# Patient Record
Sex: Female | Born: 1995 | Race: Black or African American | Hispanic: No | Marital: Single | Smoking: Never smoker
Health system: Southern US, Community
[De-identification: ages and names within clinical notes are randomized; demographics above are authoritative.]

## PROBLEM LIST (undated history)

## (undated) ENCOUNTER — Inpatient Hospital Stay (HOSPITAL_COMMUNITY): Payer: Self-pay

## (undated) DIAGNOSIS — A749 Chlamydial infection, unspecified: Secondary | ICD-10-CM

## (undated) DIAGNOSIS — Q2730 Arteriovenous malformation, site unspecified: Secondary | ICD-10-CM

## (undated) DIAGNOSIS — Q278 Other specified congenital malformations of peripheral vascular system: Secondary | ICD-10-CM

## (undated) DIAGNOSIS — Z8614 Personal history of Methicillin resistant Staphylococcus aureus infection: Secondary | ICD-10-CM

## (undated) HISTORY — PX: OTHER SURGICAL HISTORY: SHX169

## (undated) HISTORY — DX: Arteriovenous malformation, site unspecified: Q27.30

---
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
## 2015-01-03 ENCOUNTER — Inpatient Hospital Stay (HOSPITAL_COMMUNITY)
Admission: AD | Admit: 2015-01-03 | Discharge: 2015-01-04 | Disposition: A | Discharge status: Home / Self Care | Payer: Medicaid Other | Source: Ambulatory Visit | Attending: Obstetrics & Gynecology | Admitting: Obstetrics & Gynecology

## 2015-01-03 ENCOUNTER — Encounter (HOSPITAL_COMMUNITY): Payer: Self-pay | Admitting: *Deleted

## 2015-01-03 DIAGNOSIS — O26891 Other specified pregnancy related conditions, first trimester: Secondary | ICD-10-CM | POA: Diagnosis not present

## 2015-01-03 DIAGNOSIS — O209 Hemorrhage in early pregnancy, unspecified: Secondary | ICD-10-CM

## 2015-01-03 DIAGNOSIS — R109 Unspecified abdominal pain: Secondary | ICD-10-CM | POA: Diagnosis present

## 2015-01-03 DIAGNOSIS — O4691 Antepartum hemorrhage, unspecified, first trimester: Secondary | ICD-10-CM | POA: Insufficient documentation

## 2015-01-03 DIAGNOSIS — Z3A09 9 weeks gestation of pregnancy: Secondary | ICD-10-CM | POA: Diagnosis not present

## 2015-01-03 DIAGNOSIS — O219 Vomiting of pregnancy, unspecified: Secondary | ICD-10-CM | POA: Diagnosis not present

## 2015-01-03 DIAGNOSIS — R112 Nausea with vomiting, unspecified: Secondary | ICD-10-CM | POA: Insufficient documentation

## 2015-01-03 DIAGNOSIS — N939 Abnormal uterine and vaginal bleeding, unspecified: Secondary | ICD-10-CM | POA: Diagnosis present

## 2015-01-03 HISTORY — DX: Other specified congenital malformations of peripheral vascular system: Q27.8

## 2015-01-03 LAB — CBC WITH DIFFERENTIAL/PLATELET
Basophils Absolute: 0 10*3/uL (ref 0.0–0.1)
Basophils Relative: 0 %
EOS ABS: 0.1 10*3/uL (ref 0.0–0.7)
Eosinophils Relative: 1 %
HCT: 35.7 % — ABNORMAL LOW (ref 36.0–46.0)
HEMOGLOBIN: 12.1 g/dL (ref 12.0–15.0)
LYMPHS ABS: 1.9 10*3/uL (ref 0.7–4.0)
Lymphocytes Relative: 11 %
MCH: 28.5 pg (ref 26.0–34.0)
MCHC: 33.9 g/dL (ref 30.0–36.0)
MCV: 84.2 fL (ref 78.0–100.0)
MONO ABS: 1.3 10*3/uL — AB (ref 0.1–1.0)
MONOS PCT: 8 %
NEUTROS PCT: 80 %
Neutro Abs: 14 10*3/uL — ABNORMAL HIGH (ref 1.7–7.7)
Platelets: 253 10*3/uL (ref 150–400)
RBC: 4.24 MIL/uL (ref 3.87–5.11)
RDW: 14.7 % (ref 11.5–15.5)
WBC: 17.4 10*3/uL — ABNORMAL HIGH (ref 4.0–10.5)

## 2015-01-03 LAB — URINALYSIS, ROUTINE W REFLEX MICROSCOPIC
Bilirubin Urine: NEGATIVE
Glucose, UA: NEGATIVE mg/dL
Ketones, ur: 15 mg/dL — AB
Nitrite: NEGATIVE
Protein, ur: NEGATIVE mg/dL
Specific Gravity, Urine: >1.03 — ABNORMAL HIGH (ref 1.005–1.030)
Urobilinogen, UA: 0.2 mg/dL (ref 0.0–1.0)
pH: 5.5 (ref 5.0–8.0)

## 2015-01-03 LAB — URINE MICROSCOPIC-ADD ON

## 2015-01-03 LAB — POCT PREGNANCY, URINE: Preg Test, Ur: POSITIVE — AB

## 2015-01-03 NOTE — MAU Note (Signed)
Had postive upt late August and had spotting mid Sept and Sept 27 thru 28. LMP 7/28 Has had some abd pain off and on.

## 2015-01-03 NOTE — MAU Provider Note (Signed)
History     CSN: [id number]  Arrival date and time: 01/03/15 2235   First Provider Initiated Contact with Patient 01/03/15 2323      Chief Complaint  Patient presents with  . Abdominal Pain   HPI Maria Orr 19 y.o. G1P0  presents to MAU for evaluation of vaginal bleeding.  She reports her LMP was 7/28 and was normal - lasting 3 days with heavy flow each day.  On 9/16, she started 3 days of spotting.  She has cramping in her mid lower abdomen that comes and goes but is not bothersome.  She is eating and drinking well.     She is having intermittent nausea.  She denies weakness, SOB, CP, URI sxs, dysuria, other signs of sickness.   OB History    Gravida Para Term Preterm AB TAB SAB Ectopic Multiple Living   1               Past Medical History  Diagnosis Date  . 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 Cancer Brother   . Neuropathy Mother     Social History  Substance Use Topics  . Smoking status: Never Smoker   . Smokeless tobacco: None  . Alcohol Use: No    Allergies: No Known Allergies  No prescriptions prior to admission    ROS Pertinent ROS in HPI.  All other systems are negative.   Physical Exam   Blood pressure 136/83, pulse 112, temperature 98.5 F (36.9 C), resp. rate 18, height  (1.549 m), weight 141 lb 6.4 oz (64.139 kg), last menstrual period 10/30/2014.  Physical Exam  Constitutional: She is oriented to person, place, and time. She appears well-developed and well-nourished. No distress.  HENT:  Head: Normocephalic and atraumatic.  Eyes: EOM are normal.  Neck: Normal range of motion.  Cardiovascular: Normal rate.   Respiratory: No respiratory distress.  GI: Soft.  Genitourinary:  Large amt of thin, frothy white discharge  Cervix with small  punctate erythematous regions No CMT.  No adnexal mass or tenderness  Musculoskeletal: Normal range of motion.  Neurological: She is alert and oriented to person, place, and time.  Skin: Skin is warm and dry.  Psychiatric: She has a normal mood and affect.   Results for orders placed or performed during the hospital encounter of 01/03/15 (from the past 24 hour(s))  Urinalysis, Routine w reflex microscopic (not at [facility])     Status: Abnormal   Collection Time: 01/03/15 10:55 PM  Result Value Ref Range   Color, Urine YELLOW YELLOW   APPearance HAZY (A) CLEAR   Specific Gravity, Urine >1.030 (H) 1.005 - 1.030   pH 5.5 5.0 - 8.0   Glucose, UA NEGATIVE NEGATIVE mg/dL   Hgb urine dipstick LARGE (A) NEGATIVE   Bilirubin Urine NEGATIVE NEGATIVE   Ketones, ur 15 (A) NEGATIVE mg/dL   Protein, ur NEGATIVE NEGATIVE mg/dL   Urobilinogen, UA 0.2 0.0 - 1.0 mg/dL   Nitrite NEGATIVE NEGATIVE   Leukocytes, UA SMALL (A) NEGATIVE  Urine microscopic-add on     Status: Abnormal   Collection Time: 01/03/15 10:55 PM  Result Value Ref Range   Squamous Epithelial / LPF FEW (A) RARE   WBC, UA 3-6 <3  WBC/hpf   RBC / HPF 3-6 <3 RBC/hpf   Bacteria, UA FEW (A) RARE   Urine-Other MUCOUS PRESENT   Pregnancy, urine POC     Status: Abnormal   Collection Time: 01/03/15 11:02 PM  Result Value Ref Range   Preg Test, Ur POSITIVE (A) NEGATIVE  CBC with Differential/Platelet     Status: Abnormal   Collection Time: 01/03/15 11:35 PM  Result Value Ref Range   WBC 17.4 (H) 4.0 - 10.5 K/uL   RBC 4.24 3.87 - 5.11 MIL/uL   Hemoglobin 12.1 12.0 - 15.0 g/dL   HCT 13.0 (L) 86.5 - 78.4 %   MCV 84.2 78.0 - 100.0 fL   MCH 28.5 26.0 - 34.0 pg   MCHC 33.9 30.0 - 36.0 g/dL   RDW 69.6 29.5 - 28.4 %   Platelets 253 150 - 400 K/uL   Neutrophils Relative % 80 %   Neutro Abs 14.0 (H) 1.7 - 7.7 K/uL   Lymphocytes Relative 11 %   Lymphs Abs 1.9 0.7 - 4.0 K/uL   Monocytes Relative 8 %   Monocytes Absolute 1.3 (H) 0.1 - 1.0 K/uL    Eosinophils Relative 1 %   Eosinophils Absolute 0.1 0.0 - 0.7 K/uL   Basophils Relative 0 %   Basophils Absolute 0.0 0.0 - 0.1 K/uL  ABO/Rh     Status: None (Preliminary result)   Collection Time: 01/03/15 11:35 PM  Result Value Ref Range   ABO/RH(D) A POS   hCG, quantitative, pregnancy     Status: Abnormal   Collection Time: 01/03/15 11:35 PM  Result Value Ref Range   hCG, Beta Chain, Quant, S 113680 (H) <5 mIU/mL  Wet prep, genital     Status: Abnormal   Collection Time: 01/04/15 12:25 AM  Result Value Ref Range   Yeast Wet Prep HPF POC NONE SEEN NONE SEEN   Trich, Wet Prep NONE SEEN NONE SEEN   Clue Cells Wet Prep HPF POC FEW (A) NONE SEEN   WBC, Wet Prep HPF POC MODERATE (A) NONE SEEN   US Ob Comp Less 14 Wks  01/04/2015   CLINICAL DATA:  Intermittent pelvic pain.  Spotting.  EXAM: OBSTETRIC <14 WK ULTRASOUND  TECHNIQUE: Transabdominal ultrasound was performed for evaluation of the gestation as well as the maternal uterus and adnexal regions.  COMPARISON:  None.  FINDINGS: Intrauterine gestational sac: Visualized/normal in shape. No subchorionic hematoma.  Yolk sac:  Present  Embryo:  Present  Cardiac Activity: Present  Heart Rate: 173 bpm  CRL:   44  mm   11 w 1 d                  [place] EDC: 07/25/2015  Maternal uterus/adnexae: Physiologic appearance of the ovaries. No free pelvic fluid.  IMPRESSION: Single intrauterine gestation measuring 11 weeks 1 day. No pathologic findings.   Electronically Signed   By: Marnee Spring M.D.   On: 01/04/2015 01:18   MAU Course  Procedures  MDM Ectopic workup ordered to eval unusual vaginal bleeding in setting of positive pregnancy test and no prenatal care.   Viable IUP noted on u/s at 11w1 day.  Ectopic ruled out.     Assessment and Plan  A:  1. Nausea/vomiting in pregnancy   2. Vaginal bleeding in pregnancy, first trimester    P: Discharge to home PNV qd Begin [facility] asap Pregnancy verification letter provied Diet/eating discussed.   Pt states no nausea med required at this time.  Patient  may return to MAU as needed or if her condition were to change or worsen   Bertram Denver 01/03/2015, 11:23 PM

## 2015-01-04 ENCOUNTER — Inpatient Hospital Stay (HOSPITAL_COMMUNITY): Payer: Medicaid Other

## 2015-01-04 ENCOUNTER — Encounter (HOSPITAL_COMMUNITY): Payer: Self-pay | Admitting: *Deleted

## 2015-01-04 DIAGNOSIS — O219 Vomiting of pregnancy, unspecified: Secondary | ICD-10-CM | POA: Diagnosis not present

## 2015-01-04 LAB — ABO/RH: ABO/RH(D): A POS

## 2015-01-04 LAB — HIV ANTIBODY (ROUTINE TESTING W REFLEX): HIV SCREEN 4TH GENERATION: NONREACTIVE

## 2015-01-04 LAB — RPR: RPR Ser Ql: NONREACTIVE

## 2015-01-04 LAB — WET PREP, GENITAL
TRICH WET PREP: NONE SEEN
YEAST WET PREP: NONE SEEN

## 2015-01-04 LAB — HCG, QUANTITATIVE, PREGNANCY: HCG, BETA CHAIN, QUANT, S: 113680 m[IU]/mL — AB (ref <5)

## 2015-01-04 MED ORDER — PRENATAL VITAMINS PLUS 27-1 MG PO TABS
1.0000 | ORAL_TABLET | Freq: Every day | ORAL | Status: DC
Start: 2015-01-04 — End: 2020-12-14

## 2015-01-04 NOTE — Progress Notes (Signed)
Clydie Braun Teague-clark PA in to discuss test results and d/c plan. Written and verbal d/c instructions given and understanding voiced

## 2015-01-04 NOTE — Discharge Instructions (Signed)
Prenatal Care  WHAT IS PRENATAL CARE?  Prenatal care means health care during your pregnancy, before your baby is born. It is very important to take care of yourself and your baby during your pregnancy by:   Getting early prenatal care. If you know you are pregnant, or think you might be pregnant, call your health care provider as soon as possible. Schedule a visit for a prenatal exam.  Getting regular prenatal care. Follow your health care provider's schedule for blood and other necessary tests. Do not miss appointments.  Doing everything you can to keep yourself and your baby healthy during your pregnancy.  Getting complete care. Prenatal care should include evaluation of the medical, dietary, educational, psychological, and social needs of you and your significant other. The medical and genetic history of your family and the family of your baby's father should be discussed with your health care provider.  Discussing with your health care provider:  Prescription, over-the-counter, and herbal medicines that you take.  Any history of substance abuse, alcohol use, smoking, and illegal drug use.  Any history of domestic abuse and violence.  Immunizations you have received.  Your nutrition and diet.  The amount of exercise you do.  Any environmental and occupational hazards to which you are exposed.  History of sexually transmitted infections for both you and your partner.  Previous pregnancies you have had. WHY IS PRENATAL CARE SO IMPORTANT?  By regularly seeing your health care provider, you help ensure that problems can be identified early so that they can be treated as soon as possible. Other problems might be prevented. Many studies have shown that early and regular prenatal care is important for the health of mothers and their babies.  HOW CAN I TAKE CARE OF MYSELF WHILE I AM PREGNANT?  Here are ways to take care of yourself and your baby:   Start or continue taking your  multivitamin with 400 micrograms (mcg) of folic acid every day.  Get early and regular prenatal care. It is very important to see a health care provider during your pregnancy. Your health care provider will check at each visit to make sure that you and your baby are healthy. If there are any problems, action can be taken right away to help you and your baby.  Eat a healthy diet that includes:  Fruits.  Vegetables.  Foods low in saturated fat.  Whole grains.  Calcium-rich foods, such as milk, yogurt, and hard cheeses.  Drink 6-8 glasses of liquids a day.  Unless your health care provider tells you not to, try to be physically active for 30 minutes, most days of the week. If you are pressed for time, you can get your activity in through 10-minute segments, three times a day.  Do not smoke, drink alcohol, or use drugs. These can cause long-term damage to your baby. Talk with your health care provider about steps to take to stop smoking. Talk with a member of your faith community, a counselor, a trusted friend, or your health care provider if you are concerned about your alcohol or drug use.  Ask your health care provider before taking any medicine, even over-the-counter medicines. Some medicines are not safe to take during pregnancy.  Get plenty of rest and sleep.  Avoid hot tubs and saunas during pregnancy.  Do not have X-rays taken unless absolutely necessary and with the recommendation of your health care provider. A lead shield can be placed on your abdomen to protect your baby when   X-rays are taken in other parts of your body.  Do not empty the cat litter when you are pregnant. It may contain a parasite that causes an infection called toxoplasmosis, which can cause birth defects. Also, use gloves when working in garden areas used by cats.  Do not eat uncooked or undercooked meats or fish.  Do not eat soft, mold-ripened cheeses (Brie, Camembert, and chevre) or soft, blue-veined  cheese (Danish blue and Roquefort).  Stay away from toxic chemicals like:  Insecticides.  Solvents (some cleaners or paint thinners).  Lead.  Mercury.  Sexual intercourse may continue until the end of the pregnancy, unless you have a medical problem or there is a problem with the pregnancy and your health care provider tells you not to.  Do not wear high-heel shoes, especially during the second half of the pregnancy. You can lose your balance and fall.  Do not take long trips, unless absolutely necessary. Be sure to see your health care provider before going on the trip.  Do not sit in one position for more than 2 hours when on a trip.  Take a copy of your medical records when going on a trip. Know where a hospital is located in the city you are visiting, in case of an emergency.  Most dangerous household products will have pregnancy warnings on their labels. Ask your health care provider about products if you are unsure.  Limit or eliminate your caffeine intake from coffee, tea, sodas, medicines, and chocolate.  Many women continue working through pregnancy. Staying active might help you stay healthier. If you have a question about the safety or the hours you work at your particular job, talk with your health care provider.  Get informed:  Read books.  Watch videos.  Go to childbirth classes for you and your significant other.  Talk with experienced moms.  Ask your health care provider about childbirth education classes for you and your partner. Classes can help you and your partner prepare for the birth of your baby.  Ask about a baby doctor (pediatrician) and methods and pain medicine for labor, delivery, and possible cesarean delivery. HOW OFTEN SHOULD I SEE MY HEALTH CARE PROVIDER DURING PREGNANCY?  Your health care provider will give you a schedule for your prenatal visits. You will have visits more often as you get closer to the end of your pregnancy. An average  pregnancy lasts about 40 weeks.  A typical schedule includes visiting your health care provider:   About once each month during your first 6 months of pregnancy.  Every 2 weeks during the next 2 months.  Weekly in the last month, until the delivery date. Your health care provider will probably want to see you more often if:  You are older than 35 years.  Your pregnancy is high risk because you have certain health problems or problems with the pregnancy, such as:  Diabetes.  High blood pressure.  The baby is not growing on schedule, according to the dates of the pregnancy. Your health care provider will do special tests to make sure you and your baby are not having any serious problems. WHAT HAPPENS DURING PRENATAL VISITS?   At your first prenatal visit, your health care provider will do a physical exam and talk to you about your health history and the health history of your partner and your family. Your health care provider will be able to tell you what date to expect your baby to be born on.  Your   first physical exam will include checks of your blood pressure, measurements of your height and weight, and an exam of your pelvic organs. Your health care provider will do a Pap test if you have not had one recently and will do cultures of your cervix to make sure there is no infection.  At each prenatal visit, there will be tests of your blood, urine, blood pressure, weight, and the progress of the baby will be checked.  At your later prenatal visits, your health care provider will check how you are doing and how your baby is developing. You may have a number of tests done as your pregnancy progresses.  Ultrasound exams are often used to check on your baby's growth and health.  You may have more urine and blood tests, as well as special tests, if needed. These may include amniocentesis to examine fluid in the pregnancy sac, stress tests to check how the baby responds to contractions, or a  biophysical profile to measure your baby's well-being. Your health care provider will explain the tests and why they are necessary.  You should be tested for high blood sugar (gestational diabetes) between the 24th and 28th weeks of your pregnancy.  You should discuss with your health care provider your plans to breastfeed or bottle-feed your baby.  Each visit is also a chance for you to learn about staying healthy during pregnancy and to ask questions. Document Released: 03/24/2003 Document Revised: 03/26/2013 Document Reviewed: 06/05/2013 ExitCare Patient Information 2015 [employer]. This information is not intended to replace advice given to you by your health care provider. Make sure you discuss any questions you have with your health care provider.  

## 2015-01-05 LAB — GC/CHLAMYDIA PROBE AMP (~~LOC~~) NOT AT ARMC
Chlamydia: POSITIVE — AB
Neisseria Gonorrhea: NEGATIVE

## 2015-01-06 ENCOUNTER — Telehealth: Payer: Self-pay | Admitting: *Deleted

## 2015-01-06 DIAGNOSIS — A749 Chlamydial infection, unspecified: Secondary | ICD-10-CM

## 2015-01-06 DIAGNOSIS — O98811 Other maternal infectious and parasitic diseases complicating pregnancy, first trimester: Principal | ICD-10-CM

## 2015-01-06 MED ORDER — AZITHROMYCIN 500 MG PO TABS: ORAL_TABLET | ORAL | Status: DC

## 2015-01-06 NOTE — Telephone Encounter (Signed)
Telephone call to patient regarding positive chlamydia culture, patient notified.  Patient has not been treated, so Rx routed to her pharmacy per protocol.  Instructed patient to notify her partner for treatment and to abstain from sex for seven days post treatment.  Report faxed to health department.

## 2015-02-07 ENCOUNTER — Inpatient Hospital Stay (HOSPITAL_COMMUNITY)
Admission: AD | Admit: 2015-02-07 | Discharge: 2015-02-07 | Disposition: A | Discharge status: Home / Self Care | Payer: Medicaid Other | Source: Ambulatory Visit | Attending: Obstetrics and Gynecology | Admitting: Obstetrics and Gynecology

## 2015-02-07 ENCOUNTER — Encounter (HOSPITAL_COMMUNITY): Payer: Self-pay | Admitting: *Deleted

## 2015-02-07 DIAGNOSIS — O98812 Other maternal infectious and parasitic diseases complicating pregnancy, second trimester: Secondary | ICD-10-CM | POA: Insufficient documentation

## 2015-02-07 DIAGNOSIS — Z3A16 16 weeks gestation of pregnancy: Secondary | ICD-10-CM | POA: Diagnosis not present

## 2015-02-07 DIAGNOSIS — R102 Pelvic and perineal pain: Secondary | ICD-10-CM | POA: Diagnosis present

## 2015-02-07 DIAGNOSIS — B373 Candidiasis of vulva and vagina: Secondary | ICD-10-CM | POA: Diagnosis not present

## 2015-02-07 DIAGNOSIS — B3731 Acute candidiasis of vulva and vagina: Secondary | ICD-10-CM

## 2015-02-07 LAB — WET PREP, GENITAL
Clue Cells Wet Prep HPF POC: NONE SEEN
TRICH WET PREP: NONE SEEN

## 2015-02-07 MED ORDER — NYSTATIN 100000 UNIT/GM EX CREA: TOPICAL_CREAM | CUTANEOUS | Status: DC

## 2015-02-07 MED ORDER — TERCONAZOLE 0.8 % VA CREA: 1.0000 | TOPICAL_CREAM | Freq: Every day | VAGINAL | Status: DC

## 2015-02-07 NOTE — MAU Note (Signed)
Pt presents to MAU with complaints of vaginal irritation with pain. Pt states that she has just changed soaps. Denies any vaginal bleeding

## 2015-02-07 NOTE — MAU Provider Note (Signed)
  History     CSN: [id number]  Arrival date and time: 02/07/15 1800   First Provider Initiated Contact with Patient 02/07/15 1929      Chief Complaint  Patient presents with  . Vaginal Pain   HPI [name] is a 19 y.o. G1P0 at 718w1d who presents to MAU today with complaint of vaginal discharge and irritation. She states that this has been present for the last few days. She has changed some of her soap products and feels that may be related. She states itching both internally and externally of the vaginal area. She is also having a thick, white discharge. She notes redness of the labia, but denies sores or lesions. She denies abdominal pain, vaginal bleeding, fever or UTI symptoms. She has an appointment for the [facility] to start prenatal next week.   OB History    Gravida Para Term Preterm AB TAB SAB Ectopic Multiple Living   1               Past Medical History  Diagnosis Date  . 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 Cancer Brother   . Neuropathy Mother     Social History  Substance Use Topics  . Smoking status: Never Smoker   . Smokeless tobacco: None  . Alcohol Use: No    Allergies: No Known Allergies  No prescriptions prior to admission    Review of Systems  Constitutional: Negative for fever and malaise/fatigue.  Gastrointestinal: Negative for abdominal pain.  Genitourinary: Negative for dysuria, urgency and frequency.       + vaginal discharge Neg - vaginal bleeding   Physical Exam   Blood pressure 122/74, pulse 97, temperature 98 F (36.7 C), resp. rate 18, height 5\' 1"  (1.549 m), weight 135 lb (61.236 kg), last menstrual period 10/30/2014.  Physical Exam  Nursing note and vitals reviewed. Constitutional: She is oriented to person, place, and time. She  appears well-developed and well-nourished. No distress.  HENT:  Head: Normocephalic and atraumatic.  Cardiovascular: Normal rate.   Respiratory: Effort normal.  GI: Soft. She exhibits no distension and no mass. There is no tenderness. There is no rebound and no guarding.  Genitourinary: Uterus is enlarged. Uterus is not tender. Cervix exhibits no motion tenderness, no discharge and no friability. No bleeding in the vagina. Vaginal discharge (moderate amount of thick white-yellow discharge noted) found.  Neurological: She is alert and oriented to person, place, and time.  Skin: Skin is warm and dry. No erythema.  Psychiatric: She has a normal mood and affect.  Dilation: Closed Effacement (%): Thick Exam by:: Maria NippleJulie Naman Spychalski PA   MAU Course  Procedures None  MDM FHR - 145 bpm with doppler UA, wet prep today  Assessment and Plan  A: SIUP at 2718w1d Yeast vulvovaginitis, clinical  P: Discharge home Rx for Terazol and Nystatin given to patient Second trimester precautions discussed Patient advised to follow-up with [facility] as planned to start prenatal care this week Patient may return to MAU as needed or if her condition were to change or worsen  Maria LowensteinJulie N Maria Code, PA-C  02/07/2015, 8:06 PM

## 2015-02-07 NOTE — Progress Notes (Signed)
Wet prep only  

## 2015-02-07 NOTE — Discharge Instructions (Signed)

## 2015-02-09 ENCOUNTER — Other Ambulatory Visit (HOSPITAL_COMMUNITY)
Admission: RE | Admit: 2015-02-09 | Discharge: 2015-02-09 | Disposition: A | Discharge status: Home / Self Care | Payer: Medicaid Other | Source: Ambulatory Visit | Attending: Family Medicine | Admitting: Family Medicine

## 2015-02-09 ENCOUNTER — Ambulatory Visit (INDEPENDENT_AMBULATORY_CARE_PROVIDER_SITE_OTHER): Payer: Medicaid Other | Admitting: Family Medicine

## 2015-02-09 ENCOUNTER — Encounter: Payer: Self-pay | Admitting: Family Medicine

## 2015-02-09 VITALS — BP 112/60 | HR 88 | Temp 98.4°F | Wt 141.0 lb

## 2015-02-09 DIAGNOSIS — A749 Chlamydial infection, unspecified: Secondary | ICD-10-CM | POA: Diagnosis not present

## 2015-02-09 DIAGNOSIS — O98819 Other maternal infectious and parasitic diseases complicating pregnancy, unspecified trimester: Secondary | ICD-10-CM

## 2015-02-09 DIAGNOSIS — O98319 Other infections with a predominantly sexual mode of transmission complicating pregnancy, unspecified trimester: Secondary | ICD-10-CM

## 2015-02-09 DIAGNOSIS — Z113 Encounter for screening for infections with a predominantly sexual mode of transmission: Secondary | ICD-10-CM | POA: Insufficient documentation

## 2015-02-09 DIAGNOSIS — O0992 Supervision of high risk pregnancy, unspecified, second trimester: Secondary | ICD-10-CM | POA: Diagnosis not present

## 2015-02-09 DIAGNOSIS — O0990 Supervision of high risk pregnancy, unspecified, unspecified trimester: Secondary | ICD-10-CM | POA: Insufficient documentation

## 2015-02-09 DIAGNOSIS — Q2730 Arteriovenous malformation, site unspecified: Secondary | ICD-10-CM

## 2015-02-09 LAB — POCT URINALYSIS DIP (DEVICE)
Bilirubin Urine: NEGATIVE
Glucose, UA: NEGATIVE mg/dL
NITRITE: NEGATIVE
PH: 5.5 (ref 5.0–8.0)
PROTEIN: NEGATIVE mg/dL
Specific Gravity, Urine: >=1.03 (ref 1.005–1.030)
Urobilinogen, UA: 0.2 mg/dL (ref 0.0–1.0)

## 2015-02-09 NOTE — Progress Notes (Signed)
Nutrition note: 1st visit consult Pt has lost 4# @ 3747w3d, which pt thinks is related to the N&V she sometimes has. Pt reports eating 3 meals & 6 snacks/d. Pt is taking a PNV & pt thinks this is why she is having N&V. Pt reports no heartburn. Pt received verbal & written education on general nutrition during pregnancy. Discussed tips to decrease N&V and suggested pt try 2 chewable multivitamins instead of PNV. Discussed importance & benefits of BF. Encouraged pt to eat a protein source with all meals & snacks. Discussed wt gain goals of 15-25# or 0.6#/wk. Pt agrees to continue taking a PNV or equivalent.  Pt does not have WIC but plans to apply. Pt plans to BF. F/u as needed Blondell RevealLaura Sabriya Yono, MS, RD, LDN, [facility]

## 2015-02-09 NOTE — Progress Notes (Signed)
Subjective:  Maria Orr is a 19 y.o. G1P0 at 4975w3d being seen today for ongoing prenatal care.  Patient reports no complaints.  Contractions: Not present.  Vag. Bleeding: None. Movement: Present. Denies leaking of fluid.   [facility] for abdominal pain- limited US, dx with GERD  The following portions of the patient's history were reviewed and updated as appropriate: allergies, current medications, past family history, past medical history, past social history, past surgical history and problem list. Problem list updated.  Objective:   Filed Vitals:   02/09/15 0925  BP: 112/60  Pulse: 88  Temp: 98.4 F (36.9 C)  Weight: 141 lb (63.957 kg)    Fetal Status: Fetal Heart Rate (bpm): 150   Movement: Present     General:  Alert, oriented and cooperative. Patient is in no acute distress.  Skin: Skin is warm and dry. No rash noted.   Cardiovascular: Normal heart rate noted  Respiratory: Normal respiratory effort, no problems with respiration noted  Abdomen: Soft, gravid, appropriate for gestational age. Pain/Pressure: Absent     Pelvic: Vag. Bleeding: None Vag D/C Character: White   Cervical exam deferred        Extremities: Normal range of motion.  Edema: None  Mental Status: Normal mood and affect. Normal behavior. Normal judgment and thought content.   Urinalysis: Urine Protein: Negative Urine Glucose: Negative  Assessment and Plan:  Pregnancy: G1P0 at 5475w3d  1. Supervision of high risk pregnancy, antepartum, second trimester - added pregnancy box - Prenatal Profile - Prescript Monitor Profile(19) - Culture, OB Urine - GC/Chlamydia probe amp ([place])not at [facility] - Schedule anatomy scan  2. Chlamydia infection during pregnancy, antepartum -TOC today but discussed that likely will be positive since she vomited up azithro  3. AVM (congenital arteriovenous malformation) - No need for additional testing or HRC --> [facility]  Preterm labor symptoms and general  obstetric precautions including but not limited to vaginal bleeding, contractions, leaking of fluid and fetal movement were reviewed in detail with the patient. Please refer to After Visit Summary for other counseling recommendations.  Return in about 4 weeks (around 03/09/2015) for Routine prenatal care.  Federico FlakeKimberly Niles Prescious Hurless, MD

## 2015-02-09 NOTE — Patient Instructions (Signed)

## 2015-02-09 NOTE — Progress Notes (Signed)
Here for first ob visit. Has been to MAU. Given prenatal education booklets. Moderate hemoglobin noted in urinalysis.

## 2015-02-09 NOTE — Progress Notes (Signed)
Anatomy U/S with [facility] 03/09/15 @ 730a

## 2015-02-10 LAB — PRESCRIPTION MONITORING PROFILE (19 PANEL)
AMPHETAMINE/METH: NEGATIVE ng/mL
BARBITURATE SCREEN, URINE: NEGATIVE ng/mL
BENZODIAZEPINE SCREEN, URINE: NEGATIVE ng/mL
Buprenorphine, Urine: NEGATIVE ng/mL
COCAINE METABOLITES: NEGATIVE ng/mL
CREATININE, URINE: 277.09 mg/dL (ref >20.0)
Cannabinoid Scrn, Ur: NEGATIVE ng/mL
Carisoprodol, Urine: NEGATIVE ng/mL
Fentanyl, Ur: NEGATIVE ng/mL
MDMA URINE: NEGATIVE ng/mL
Meperidine, Ur: NEGATIVE ng/mL
Methadone Screen, Urine: NEGATIVE ng/mL
Methaqualone: NEGATIVE ng/mL
NITRITES URINE, INITIAL: NEGATIVE ug/mL
OPIATE SCREEN, URINE: NEGATIVE ng/mL
Oxycodone Screen, Ur: NEGATIVE ng/mL
PH URINE, INITIAL: 5.5 pH (ref 4.5–8.9)
Phencyclidine, Ur: NEGATIVE ng/mL
Propoxyphene: NEGATIVE ng/mL
TRAMADOL UR: NEGATIVE ng/mL
Tapentadol, urine: NEGATIVE ng/mL
ZOLPIDEM, URINE: NEGATIVE ng/mL

## 2015-02-10 LAB — CULTURE, OB URINE: Colony Count: 8000

## 2015-02-10 LAB — PRENATAL PROFILE (SOLSTAS)
Antibody Screen: NEGATIVE
Basophils Absolute: 0 10*3/uL (ref 0.0–0.1)
Basophils Relative: 0 % (ref 0–1)
EOS PCT: 0 % (ref 0–5)
Eosinophils Absolute: 0 10*3/uL (ref 0.0–0.7)
HEMATOCRIT: 35.5 % — AB (ref 36.0–46.0)
HIV: NONREACTIVE
Hemoglobin: 12 g/dL (ref 12.0–15.0)
Hepatitis B Surface Ag: NEGATIVE
LYMPHS PCT: 20 % (ref 12–46)
Lymphs Abs: 2.2 10*3/uL (ref 0.7–4.0)
MCH: 28.8 pg (ref 26.0–34.0)
MCHC: 33.8 g/dL (ref 30.0–36.0)
MCV: 85.1 fL (ref 78.0–100.0)
MONO ABS: 0.8 10*3/uL (ref 0.1–1.0)
MONOS PCT: 7 % (ref 3–12)
MPV: 8.9 fL (ref 8.6–12.4)
Neutro Abs: 8.1 10*3/uL — ABNORMAL HIGH (ref 1.7–7.7)
Neutrophils Relative %: 73 % (ref 43–77)
PLATELETS: 289 10*3/uL (ref 150–400)
RBC: 4.17 MIL/uL (ref 3.87–5.11)
RDW: 15 % (ref 11.5–15.5)
RH TYPE: POSITIVE
RUBELLA: 6.18 {index} — AB (ref <0.90)
WBC: 11.1 10*3/uL — AB (ref 4.0–10.5)

## 2015-02-10 LAB — GC/CHLAMYDIA PROBE AMP (~~LOC~~) NOT AT ARMC
Chlamydia: NEGATIVE
NEISSERIA GONORRHEA: NEGATIVE

## 2015-02-12 ENCOUNTER — Inpatient Hospital Stay (HOSPITAL_COMMUNITY): Payer: Medicaid Other

## 2015-02-12 ENCOUNTER — Encounter (HOSPITAL_COMMUNITY): Payer: Self-pay

## 2015-02-12 ENCOUNTER — Inpatient Hospital Stay (HOSPITAL_COMMUNITY)
Admission: AD | Admit: 2015-02-12 | Discharge: 2015-02-12 | Disposition: A | Discharge status: Home / Self Care | Payer: Medicaid Other | Source: Ambulatory Visit | Attending: Family Medicine | Admitting: Family Medicine

## 2015-02-12 DIAGNOSIS — O26852 Spotting complicating pregnancy, second trimester: Secondary | ICD-10-CM

## 2015-02-12 DIAGNOSIS — R109 Unspecified abdominal pain: Secondary | ICD-10-CM | POA: Diagnosis present

## 2015-02-12 DIAGNOSIS — Z3A16 16 weeks gestation of pregnancy: Secondary | ICD-10-CM | POA: Diagnosis not present

## 2015-02-12 DIAGNOSIS — O26899 Other specified pregnancy related conditions, unspecified trimester: Secondary | ICD-10-CM

## 2015-02-12 DIAGNOSIS — O209 Hemorrhage in early pregnancy, unspecified: Secondary | ICD-10-CM | POA: Insufficient documentation

## 2015-02-12 DIAGNOSIS — O4692 Antepartum hemorrhage, unspecified, second trimester: Secondary | ICD-10-CM

## 2015-02-12 DIAGNOSIS — O4102X0 Oligohydramnios, second trimester, not applicable or unspecified: Secondary | ICD-10-CM | POA: Diagnosis not present

## 2015-02-12 DIAGNOSIS — O42919 Preterm premature rupture of membranes, unspecified as to length of time between rupture and onset of labor, unspecified trimester: Secondary | ICD-10-CM

## 2015-02-12 LAB — CBC
HCT: 33.8 % — ABNORMAL LOW (ref 36.0–46.0)
HEMOGLOBIN: 11.3 g/dL — AB (ref 12.0–15.0)
MCH: 28.4 pg (ref 26.0–34.0)
MCHC: 33.4 g/dL (ref 30.0–36.0)
MCV: 84.9 fL (ref 78.0–100.0)
Platelets: 258 10*3/uL (ref 150–400)
RBC: 3.98 MIL/uL (ref 3.87–5.11)
RDW: 14.8 % (ref 11.5–15.5)
WBC: 11.9 10*3/uL — ABNORMAL HIGH (ref 4.0–10.5)

## 2015-02-12 LAB — URINALYSIS, ROUTINE W REFLEX MICROSCOPIC
Bilirubin Urine: NEGATIVE
GLUCOSE, UA: NEGATIVE mg/dL
KETONES UR: NEGATIVE mg/dL
Nitrite: NEGATIVE
PROTEIN: NEGATIVE mg/dL
Specific Gravity, Urine: 1.02 (ref 1.005–1.030)
Urobilinogen, UA: 0.2 mg/dL (ref 0.0–1.0)
pH: 6 (ref 5.0–8.0)

## 2015-02-12 LAB — URINE MICROSCOPIC-ADD ON

## 2015-02-12 NOTE — Discharge Instructions (Signed)
Premature Rupture and Preterm Premature Rupture of Membranes Premature rupture of membranes (PROM) is when the membranes (amniotic sac) break open before contractions or labor starts. Rupture of membranes is commonly referred to as your water breaking. If PROM occurs before 37 weeks of pregnancy, it is called preterm premature rupture of membranes (PPROM). The amniotic sac holds the fetus, keeps infection out, and performs other important functions. Having the amniotic sac rupture before 37 weeks of pregnancy can lead to serious problems and requires immediate attention by your health care provider. CAUSES  PROM near the end of the pregnancy may be caused by natural weakening of the membranes. PPROM is often due to an infection. Other factors that may be associated with PROM include:  Stretching of the amniotic sac because of carrying multiples or having too much amniotic fluid.  Trauma.  Smoking during pregnancy.  Poor nutrition.  Previous preterm birth.  Vaginal bleeding.  Little to no prenatal care.  Problems with the placenta, such as placenta previa or placental abruption. RISKS OF PROM AND PPROM  Delivering a premature baby.  Getting a serious infection of the placental tissues (chorioamnionitis).  Early detachment of the placenta from the uterus (placental abruption).  Compression of the umbilical cord.  Needing a cesarean birth.  Developing a serious infection after delivery. SIGNS OF PROM OR PPROM   A sudden gush or slow leaking of fluid from the vagina.  Constant wet underwear. Sometimes, women mistake the leaking or wetness for urine, especially if the leak is slow and not a gush of fluid. If there is constant leaking or your underwear continues to get wet, your membranes have likely ruptured. WHAT TO DO IF YOU THINK YOUR MEMBRANES HAVE RUPTURED Call your health care provider right away. You will need to go to the hospital to get checked immediately. WHAT HAPPENS  IF YOU ARE DIAGNOSED WITH PROM OR PPROM? Once you arrive at the hospital, you will have tests done. A speculum exam will be performed to check if the cervix has softened or started to open (dilate).   If you have PPROM, you:  And your baby will be monitored closely for signs of infection or other complications.  May be given an antibiotic medicine to lower the chances of an infection developing.  May be given a steroid medicine to help mature the baby's lungs faster.  May be given a medicine to stop preterm labor.  May be ordered to be on bed rest at home or in the hospital.  May be induced if complications arise for you or the baby. Your treatment will depend on many factors, such as how far along you are, the development of the baby, and other complications that may arise.   This information is not intended to replace advice given to you by your health care provider. Make sure you discuss any questions you have with your health care provider.   Document Released: 03/21/2005 Document Revised: 01/09/2013 Document Reviewed: 07/10/2012 Elsevier Interactive Patient Education [employer]2016 Elsevier Inc.

## 2015-02-12 NOTE — MAU Provider Note (Signed)
Chief Complaint: Abdominal Cramping and Vaginal Bleeding   First Provider Initiated Contact with Patient 02/12/15 1336     SUBJECTIVE HPI: Maria Orr is a 19 y.o. G1P0 at [redacted]w[redacted]d who presents to Maternity Admissions reporting one episode of severe low abd cramping at 0700 this morning followed by moderate-heavy bright red bleeding when she got up to the bathroom. No further pain. Continues to have light bleeding. Getting PNC at [facility]. Seen in MAU 01/03/15 for VB. Dx Chlamydia. US showed 11.1 week living SIUP, normal fluid. No SCH. No bleeding since then. No recent IC.    Chlamydia positive this pregnancy. Test of cure negative.  Location: suprapubic Quality: cramping Severity: severe Duration: few minutes Context: upon waking Timing: one, constant Course: resolved spontaneously   Modifying factors: None Associated signs and symptoms: Pos for VB. Neg for fever, chills, passage of clots or tissue, abd tenderness, vaginal discharge.   Past Medical History  Diagnosis Date  . 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 Marital Status: Single    Spouse Name: N/A  . Number of Children: N/A  . Years of Education: N/A   Occupational History  . Not on file.   Social History Main Topics  . Smoking status: Never Smoker   . Smokeless tobacco: Never Used  . Alcohol Use: No  . Drug Use: No  . Sexual Activity: Yes    Birth Control/ Protection: None   Other Topics Concern  . Not on file   Social History Narrative   No current facility-administered medications on file prior to encounter.   Current Outpatient Prescriptions on File Prior to  Encounter  Medication Sig Dispense Refill  . Prenatal Vit-Fe Fumarate-FA (PRENATAL VITAMINS PLUS) 27-1 MG TABS Take 1 tablet by mouth daily. 30 tablet 11   No Known Allergies  I have reviewed the past Medical Hx, Surgical Hx, Social Hx, Allergies and Medications.   Review of Systems  Constitutional: Negative for chills and fatigue.  Gastrointestinal: Positive for abdominal pain. Negative for nausea, vomiting, diarrhea and constipation.  Genitourinary: Positive for vaginal bleeding. Negative for dysuria, urgency, frequency, hematuria, flank pain and vaginal discharge.  Neurological: Negative for dizziness.    OBJECTIVE Patient Vitals for the past 24 hrs:  BP Temp Temp src Pulse Resp  02/12/15 1312 125/72 mmHg 98.5 F (36.9 C) Oral 115 18   Constitutional: Well-developed, well-nourished female in no acute distress.  Cardiovascular: Tachycardia Respiratory: normal rate and effort.  GI: Abd soft, non-tender, gravid appropriate for gestational age.  MS: Extremities nontender, no edema, normal ROM Neurologic: Alert and oriented x 4.  GU: Neg CVAT.  SPECULUM EXAM: NEFG, small-moderate amount of thin, red blood in vaginal vault and coming through os, cervix clean, visually closed  BIMANUAL: Deferred  LAB RESULTS Results for orders placed or performed during the hospital encounter of 02/12/15 (from the past 24 hour(s))  CBC     Status: Abnormal   Collection Time: 02/12/15  1:34 PM  Result Value Ref Range   WBC 11.9 (H) 4.0 - 10.5 K/uL   RBC 3.98 3.87 - 5.11 MIL/uL   Hemoglobin 11.3 (L) 12.0 - 15.0 g/dL   HCT 95.633.8 (L) 38.736.0 - 56.446.0 %   MCV 84.9 78.0 - 100.0 fL   MCH 28.4 26.0 - 34.0 pg   MCHC 33.4 30.0 - 36.0 g/dL   RDW 33.214.8 95.111.5 - 88.415.5 %   Platelets 258 150 - 400 K/uL  Urinalysis, Routine w reflex microscopic (not at [facility])     Status: Abnormal   Collection Time: 02/12/15  3:30 PM  Result Value Ref Range   Color, Urine YELLOW YELLOW   APPearance CLEAR CLEAR   Specific Gravity,  Urine 1.020 1.005 - 1.030   pH 6.0 5.0 - 8.0   Glucose, UA NEGATIVE NEGATIVE mg/dL   Hgb urine dipstick LARGE (A) NEGATIVE   Bilirubin Urine NEGATIVE NEGATIVE   Ketones, ur NEGATIVE NEGATIVE mg/dL   Protein, ur NEGATIVE NEGATIVE mg/dL   Urobilinogen, UA 0.2 0.0 - 1.0 mg/dL   Nitrite NEGATIVE NEGATIVE   Leukocytes, UA TRACE (A) NEGATIVE  Urine microscopic-add on     Status: Abnormal   Collection Time: 02/12/15  3:30 PM  Result Value Ref Range   Squamous Epithelial / LPF FEW (A) RARE   WBC, UA 0-2 <3 WBC/hpf   RBC / HPF 21-50 <3 RBC/hpf   Fern negative.  IMAGING   MAU COURSE US, CBC, UA, fern  US shows severe oligo. Dr. Shawnie PonsPratt informed of Sx, exam, US. Will come discuss Dx, management w/ pt. strongly suspects PBP ROM. Crist FatFern may need B negative 2/2 large amount of blood in the specimen or possibly early gestation. See attestation statement for details of this conversation. Patient prefers expectant management at this time.  MDM 19 year old female at 16 weeks 6 days with suspected PPPROM versus severe oligohydramnios of other etiology, stable vaginal bleeding and no symptoms of chorioamnionitis at this time.   ASSESSMENT 1. [redacted] weeks gestation of pregnancy   2. Vaginal bleeding in pregnancy, second trimester   3. Abdominal pain affecting pregnancy, antepartum    PLAN Discharge home in stable condition per consult with Dr. Shawnie PonsPratt. Infection and bleeding Precautions. Patient instructed to take temperature daily and come to maternity admissions for fever greater than 100.4, abdominal tenderness, purulent discharge or increased vaginal bleeding. Instructed that she may continue to have leaking of fluid and spotting and does not be to come to maternity admissions if that occurs. Patient informed that we would strongly recommend delivery ASAP chorioamnionitis occurs.  Urine culture sent. Follow-up Information    [facility] On [facility]/23/2015.   Specialty:   Obstetrics and Gynecology   Why:  Routine prenatal visit   Contact information:   [address] [place] [id number] [phone]      Follow [facility] with [facility].   Why:  As needed in emergencies (severe pain, severe bleeding, fever greater than 100.4)   Contact information:   [address] [id number] mc [place] [id number] [phone]       Medication List    TAKE these medications        nystatin cream  Commonly known as:  MYCOSTATIN  Apply 1 application topically 2 (two) times daily.     PRENATAL VITAMINS PLUS 27-1 MG Tabs  Take 1 tablet by mouth daily.       Dorathy KinsmanVirginia Alanie Syler, CNM  02/12/2015  5:43 PM

## 2015-02-12 NOTE — MAU Note (Signed)
Onset of lower abdominal pain and vaginal bleeding no clots but a lot of blood in the toilet, wearing a pad now light red in color small amount. [facility].

## 2015-02-13 ENCOUNTER — Observation Stay (HOSPITAL_COMMUNITY)
Admission: AD | Admit: 2015-02-13 | Discharge: 2015-02-14 | Disposition: A | Discharge status: Home / Self Care | Payer: Medicaid Other | Source: Ambulatory Visit | Attending: Family Medicine | Admitting: Family Medicine

## 2015-02-13 ENCOUNTER — Inpatient Hospital Stay (EMERGENCY_DEPARTMENT_HOSPITAL)
Admission: AD | Admit: 2015-02-13 | Discharge: 2015-02-13 | Disposition: A | Discharge status: Home / Self Care | Payer: Medicaid Other | Source: Ambulatory Visit | Attending: Family Medicine | Admitting: Family Medicine

## 2015-02-13 ENCOUNTER — Inpatient Hospital Stay (HOSPITAL_COMMUNITY): Payer: Medicaid Other

## 2015-02-13 ENCOUNTER — Encounter (HOSPITAL_COMMUNITY): Payer: Self-pay

## 2015-02-13 ENCOUNTER — Encounter (HOSPITAL_COMMUNITY): Payer: Self-pay | Admitting: *Deleted

## 2015-02-13 DIAGNOSIS — O039 Complete or unspecified spontaneous abortion without complication: Secondary | ICD-10-CM | POA: Diagnosis not present

## 2015-02-13 DIAGNOSIS — Z3A17 17 weeks gestation of pregnancy: Secondary | ICD-10-CM | POA: Diagnosis not present

## 2015-02-13 DIAGNOSIS — A749 Chlamydial infection, unspecified: Secondary | ICD-10-CM

## 2015-02-13 DIAGNOSIS — O209 Hemorrhage in early pregnancy, unspecified: Secondary | ICD-10-CM | POA: Diagnosis present

## 2015-02-13 DIAGNOSIS — O4690 Antepartum hemorrhage, unspecified, unspecified trimester: Secondary | ICD-10-CM

## 2015-02-13 DIAGNOSIS — O42912 Preterm premature rupture of membranes, unspecified as to length of time between rupture and onset of labor, second trimester: Secondary | ICD-10-CM

## 2015-02-13 DIAGNOSIS — O98312 Other infections with a predominantly sexual mode of transmission complicating pregnancy, second trimester: Secondary | ICD-10-CM | POA: Diagnosis not present

## 2015-02-13 DIAGNOSIS — O98819 Other maternal infectious and parasitic diseases complicating pregnancy, unspecified trimester: Principal | ICD-10-CM

## 2015-02-13 DIAGNOSIS — Q2730 Arteriovenous malformation, site unspecified: Secondary | ICD-10-CM

## 2015-02-13 DIAGNOSIS — O42919 Preterm premature rupture of membranes, unspecified as to length of time between rupture and onset of labor, unspecified trimester: Secondary | ICD-10-CM

## 2015-02-13 DIAGNOSIS — A5609 Other chlamydial infection of lower genitourinary tract: Secondary | ICD-10-CM | POA: Diagnosis not present

## 2015-02-13 DIAGNOSIS — O034 Incomplete spontaneous abortion without complication: Secondary | ICD-10-CM | POA: Diagnosis present

## 2015-02-13 DIAGNOSIS — O4102X1 Oligohydramnios, second trimester, fetus 1: Secondary | ICD-10-CM

## 2015-02-13 LAB — CBC
HCT: 32.3 % — ABNORMAL LOW (ref 36.0–46.0)
Hemoglobin: 10.9 g/dL — ABNORMAL LOW (ref 12.0–15.0)
MCH: 28.7 pg (ref 26.0–34.0)
MCHC: 33.7 g/dL (ref 30.0–36.0)
MCV: 85 fL (ref 78.0–100.0)
PLATELETS: 261 10*3/uL (ref 150–400)
RBC: 3.8 MIL/uL — ABNORMAL LOW (ref 3.87–5.11)
RDW: 14.7 % (ref 11.5–15.5)
WBC: 15.3 10*3/uL — ABNORMAL HIGH (ref 4.0–10.5)

## 2015-02-13 LAB — TYPE AND SCREEN
ABO/RH(D): A POS
ANTIBODY SCREEN: NEGATIVE

## 2015-02-13 LAB — MRSA PCR SCREENING: MRSA by PCR: POSITIVE — AB

## 2015-02-13 MED ORDER — FENTANYL CITRATE (PF) 100 MCG/2ML IJ SOLN
50.0000 ug | INTRAMUSCULAR | Status: DC | PRN
  Administered 2015-02-13 (×2): 50 ug via INTRAVENOUS
  Filled 2015-02-13 (×2): qty 2

## 2015-02-13 MED ORDER — OXYCODONE-ACETAMINOPHEN 5-325 MG PO TABS: 1.0000 | ORAL_TABLET | Freq: Four times a day (QID) | ORAL | Status: DC | PRN

## 2015-02-13 MED ORDER — OXYCODONE-ACETAMINOPHEN 5-325 MG PO TABS
1.0000 | ORAL_TABLET | ORAL | Status: DC | PRN
Start: 2015-02-13 — End: 2015-02-14

## 2015-02-13 MED ORDER — DOCUSATE SODIUM 100 MG PO CAPS
100.0000 mg | ORAL_CAPSULE | Freq: Every day | ORAL | Status: DC
  Administered 2015-02-14: 100 mg via ORAL
  Filled 2015-02-13: qty 1

## 2015-02-13 MED ORDER — MUPIROCIN 2 % EX OINT
1.0000 "application " | TOPICAL_OINTMENT | Freq: Two times a day (BID) | CUTANEOUS | Status: DC
  Administered 2015-02-13 – 2015-02-14 (×2): 1 via NASAL
  Filled 2015-02-13: qty 22

## 2015-02-13 MED ORDER — ACETAMINOPHEN 325 MG PO TABS
650.0000 mg | ORAL_TABLET | ORAL | Status: DC | PRN
  Administered 2015-02-13: 650 mg via ORAL
  Filled 2015-02-13: qty 2

## 2015-02-13 MED ORDER — MISOPROSTOL 200 MCG PO TABS
800.0000 ug | ORAL_TABLET | Freq: Once | ORAL | Status: AC
  Administered 2015-02-13: 800 ug via ORAL
  Filled 2015-02-13: qty 4

## 2015-02-13 MED ORDER — ZOLPIDEM TARTRATE 5 MG PO TABS: 5.0000 mg | ORAL_TABLET | Freq: Every evening | ORAL | Status: DC | PRN

## 2015-02-13 MED ORDER — LACTATED RINGERS IV SOLN
INTRAVENOUS | Status: DC
  Administered 2015-02-13 – 2015-02-14 (×2): via INTRAVENOUS

## 2015-02-13 MED ORDER — CALCIUM CARBONATE ANTACID 500 MG PO CHEW: 2.0000 | CHEWABLE_TABLET | ORAL | Status: DC | PRN

## 2015-02-13 MED ORDER — PRENATAL MULTIVITAMIN CH
1.0000 | ORAL_TABLET | Freq: Every day | ORAL | Status: DC
  Administered 2015-02-14: 1 via ORAL
  Filled 2015-02-13: qty 1

## 2015-02-13 MED ORDER — CHLORHEXIDINE GLUCONATE CLOTH 2 % EX PADS
6.0000 | MEDICATED_PAD | Freq: Every day | CUTANEOUS | Status: DC
  Administered 2015-02-14: 6 via TOPICAL

## 2015-02-13 NOTE — Discharge Instructions (Signed)
Premature Rupture and Preterm Premature Rupture of Membranes Premature rupture of membranes (PROM) is when the membranes (amniotic sac) break open before contractions or labor starts. Rupture of membranes is commonly referred to as your water breaking. If PROM occurs before 37 weeks of pregnancy, it is called preterm premature rupture of membranes (PPROM). The amniotic sac holds the fetus, keeps infection out, and performs other important functions. Having the amniotic sac rupture before 37 weeks of pregnancy can lead to serious problems and requires immediate attention by your health care provider. CAUSES  PROM near the end of the pregnancy may be caused by natural weakening of the membranes. PPROM is often due to an infection. Other factors that may be associated with PROM include:  Stretching of the amniotic sac because of carrying multiples or having too much amniotic fluid.  Trauma.  Smoking during pregnancy.  Poor nutrition.  Previous preterm birth.  Vaginal bleeding.  Little to no prenatal care.  Problems with the placenta, such as placenta previa or placental abruption. RISKS OF PROM AND PPROM  Delivering a premature baby.  Getting a serious infection of the placental tissues (chorioamnionitis).  Early detachment of the placenta from the uterus (placental abruption).  Compression of the umbilical cord.  Needing a cesarean birth.  Developing a serious infection after delivery. SIGNS OF PROM OR PPROM   A sudden gush or slow leaking of fluid from the vagina.  Constant wet underwear. Sometimes, women mistake the leaking or wetness for urine, especially if the leak is slow and not a gush of fluid. If there is constant leaking or your underwear continues to get wet, your membranes have likely ruptured. WHAT TO DO IF YOU THINK YOUR MEMBRANES HAVE RUPTURED Call your health care provider right away. You will need to go to the hospital to get checked immediately. WHAT HAPPENS  IF YOU ARE DIAGNOSED WITH PROM OR PPROM? Once you arrive at the hospital, you will have tests done. A cervical exam will be performed to check if the cervix has softened or started to open (dilate). If you are diagnosed with PROM, you may be induced within 24 hours if you are not having contractions. If you are diagnosed with PPROM and are not having contractions, you may be induced depending on your trimester.  If you have PPROM, you:  And your baby will be monitored closely for signs of infection or other complications.  May be given an antibiotic medicine to lower the chances of an infection developing.  May be given a steroid medicine to help mature the baby's lungs faster (after 23 weeks).  May be given a medicine to stop preterm labor.  May be ordered to be on bed rest at home or in the hospital.  May be induced if complications arise for you or the baby. Your treatment will depend on many factors, such as how far along you are, the development of the baby, and other complications that may arise.   This information is not intended to replace advice given to you by your health care provider. Make sure you discuss any questions you have with your health care provider.   Document Released: 03/21/2005 Document Revised: 01/09/2013 Document Reviewed: 07/10/2012 Elsevier Interactive Patient Education [employer]2016 Elsevier Inc.

## 2015-02-13 NOTE — MAU Provider Note (Signed)
  History     CSN: [id number]  Arrival date and time: 02/13/15 [id number]   First Provider Initiated Contact with Patient 02/13/15 0109      Chief Complaint  Patient presents with  . Abdominal Pain   HPI  Maria Orr is a 19 y.o. G1P0 at 3757w0d who presents today with abdominal pain, and leaking of fluid. She was diagnosed with PPROM earlier today, and dc home for expectant management. She states that since she has been home she has had some abdominal pain, and she has continued to leak fluid. She states that she was told to come back for those reasons. She also states that she still has many questions, and concerns.   Past Medical History  Diagnosis Date  . 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 Cancer Brother   . Neuropathy Mother     Social History  Substance Use Topics  . Smoking status: Never Smoker   . Smokeless tobacco: Never Used  . Alcohol Use: No    Allergies: No Known Allergies  Prescriptions prior to admission  Medication Sig Dispense Refill Last Dose  . nystatin cream (MYCOSTATIN) Apply 1 application topically 2 (two) times daily.   02/11/2015 at Unknown time  . Prenatal Vit-Fe Fumarate-FA (PRENATAL VITAMINS PLUS) 27-1 MG TABS Take 1 tablet by mouth daily. 30 tablet 11 02/12/2015 at Unknown time    Review of Systems  Constitutional: Negative for fever, chills and malaise/fatigue.  Gastrointestinal: Positive for abdominal pain. Negative for nausea and vomiting.  Genitourinary: Negative for dysuria, urgency and frequency.   Physical Exam   Blood pressure 113/59, pulse 101, temperature 98.8 F (37.1 C), temperature source Oral, resp. rate 16, last menstrual period 10/30/2014, SpO2 100 %.  Physical Exam  Nursing note and vitals reviewed. Constitutional: She is oriented  to person, place, and time. She appears well-developed and well-nourished. No distress.  HENT:  Head: Normocephalic.  Cardiovascular: Normal rate.   Respiratory: Effort normal.  GI: Soft. There is no tenderness.  Genitourinary:  Fundus: non-tender FHT 165 with doppler   Neurological: She is alert and oriented to person, place, and time.  Skin: Skin is warm and dry.  Psychiatric: She has a normal mood and affect.    MAU Course  Procedures  MDM Questions answered, and discussed care with the patient at length.  Patient states that she would like to continue to with expectant management. Reviewed warning signs at length again with the patient.   Assessment and Plan   1. Chlamydia infection during pregnancy, antepartum   2. AVM (congenital arteriovenous malformation)   3. Preterm premature rupture of membranes (PPROM) with unknown onset of labor    DC home S/S of infection reviewed at length 2nd Trimester precautions  RX: none  Return to MAU as needed Patient has an appointment with Dr. Gaynell FaceMarshall on Tuesday.   Follow-up Information    Follow up with Kathreen CosierMARSHALL,BERNARD A, MD.   Specialty:  Obstetrics and Gynecology   Why:  As scheduled   Contact information:   [address] [place] [id number] [phone]         Tawnya CrookHogan, Heather Donovan 02/13/2015, 1:27 AM

## 2015-02-13 NOTE — Progress Notes (Signed)
While providing care for pt, pt's visitors interrupts constantly with inappropriate questions and requests for her personal needs.  The visitor has been very rude to staff since arrival.

## 2015-02-13 NOTE — H&P (Signed)
HPI   [name] is a 19 y.o. female G1P0 at 5524w0d presenting with vaginal bleeding. The patient was seen yesterday in MAU and diagnosed with severe oligohydramnios and PROM. The patient elected expectant management which included, daily temps at home and  pelvic rest.She returns to MAU tonight with increased vaginal bleeding and worsening abdominal pain that comes and goes.   The patient currently rates her pain 0/10   OB History    Gravida Para Term Preterm AB TAB SAB Ectopic Multiple Living   1               Past Medical History  Diagnosis Date  . 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 Cancer Brother   . Neuropathy Mother     Social History  Substance Use Topics  . Smoking status: Never Smoker   . Smokeless tobacco: Never Used  . Alcohol Use: No    Allergies: No Known Allergies  Prescriptions prior to admission  Medication Sig Dispense Refill Last Dose  . nystatin cream (MYCOSTATIN) Apply 1 application topically 2 (two) times daily.   02/11/2015 at Unknown time  . Prenatal Vit-Fe Fumarate-FA (PRENATAL VITAMINS PLUS) 27-1 MG TABS Take 1 tablet by mouth daily. 30 tablet 11 02/12/2015 at Unknown time   Results for orders placed or performed during the hospital encounter of 02/13/15 (from the past 24 hour(s))  CBC     Status: Abnormal   Collection Time: 02/13/15  6:00 PM  Result Value Ref Range   WBC 15.3 (H) 4.0 - 10.5 K/uL   RBC 3.80 (L) 3.87 - 5.11 MIL/uL   Hemoglobin 10.9 (L) 12.0 - 15.0 g/dL   HCT 69.632.3 (L) 29.536.0 - 28.446.0 %   MCV 85.0 78.0 - 100.0 fL   MCH 28.7 26.0 - 34.0 pg   MCHC 33.7 30.0 - 36.0 g/dL   RDW 13.214.7 44.011.5 - 10.215.5 %   Platelets 261 150 - 400 K/uL    Review of Systems  Constitutional: Negative for fever and chills.  Gastrointestinal: Positive for  abdominal pain (occasional cramping ).   Physical Exam   Blood pressure 120/71, pulse 115, temperature 98.9 F (37.2 C), temperature source Oral, resp. rate 18, height 5\' 1"  (1.549 m), weight 141 lb (63.957 kg), last menstrual period 10/30/2014.  Physical Exam  Constitutional: She is oriented to person, place, and time. She appears well-developed and well-nourished. No distress.  HENT:  Head: Normocephalic.  Respiratory: Effort normal.  GI: Soft.  Genitourinary:  Bimanual exam: Cervix not felt, fetal parts felt in the vagina. Position of fetus indeterminate  Chaperone present for exam.  Musculoskeletal: Normal range of motion.  Neurological: She is alert and oriented to person, place, and time.  Skin: Skin is warm. She is not diaphoretic.  Psychiatric: Her behavior is normal.    MAU Course  Procedures  None  MDM  CBC Unable to doppler fetal heart tones.  Bedside US done Absent fetal heart tones, anhydramnios now Consult with Dr. Despina HiddenEure    Assessment and Plan   A:  1. Inevitable abortion   2. Vaginal bleeding in pregnancy   3. Anhydramnios in second trimester, fetus 1    P:  Admit to AICU/ante  Cytotec 800 mg buccal  Fentanyl for pain LR   Duane Lope, NP 02/13/2015 6:51 PM

## 2015-02-13 NOTE — MAU Note (Signed)
Attempting to auscultate FHR with doppler by myself and a second RN. Unable to obtain FHR. Venia CarbonJennifer Rasch, NP notified.

## 2015-02-13 NOTE — MAU Note (Signed)
Patient states after she left MAU last night, started having increased contractions, crampiness in her back and lower abdomen. States her "heavy" vaginal bleeding started this am.  She has changed 2 pads today.  RN observed pad on when admitted to MAU, pad full with bright red bleeding. Patient denies pain or contractions currently.

## 2015-02-13 NOTE — MAU Provider Note (Signed)
History     CSN: [id number]  Arrival date and time: 02/13/15 1648   First Provider Initiated Contact with Patient 02/13/15 1744      Chief Complaint  Patient presents with  . Vaginal Bleeding   HPI   [name] is a 19 y.o. female G1P0 at [redacted]w[redacted]d presenting with vaginal bleeding. The patient was seen yesterday in MAU and diagnosed with severe oligohydramnios and PROM. The patient elected expectant management which included, daily temps at home and  pelvic rest.She returns to MAU tonight with increased vaginal bleeding and worsening abdominal pain that comes and goes.   The patient currently rates her pain 0/10   OB History    Gravida Para Term Preterm AB TAB SAB Ectopic Multiple Living   1               Past Medical History  Diagnosis Date  . 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 Cancer Brother   . Neuropathy Mother     Social History  Substance Use Topics  . Smoking status: Never Smoker   . Smokeless tobacco: Never Used  . Alcohol Use: No    Allergies: No Known Allergies  Prescriptions prior to admission  Medication Sig Dispense Refill Last Dose  . nystatin cream (MYCOSTATIN) Apply 1 application topically 2 (two) times daily.   02/11/2015 at Unknown time  . Prenatal Vit-Fe Fumarate-FA (PRENATAL VITAMINS PLUS) 27-1 MG TABS Take 1 tablet by mouth daily. 30 tablet 11 02/12/2015 at Unknown time   Results for orders placed or performed during the hospital encounter of 02/13/15 (from the past 24 hour(s))  CBC     Status: Abnormal   Collection Time: 02/13/15  6:00 PM  Result Value Ref Range   WBC 15.3 (H) 4.0 - 10.5 K/uL   RBC 3.80 (L) 3.87 - 5.11 MIL/uL   Hemoglobin 10.9 (L) 12.0 - 15.0 g/dL   HCT 40.9 (L) 81.1 - 91.4 %   MCV 85.0 78.0 - 100.0 fL   MCH 28.7 26.0 -  34.0 pg   MCHC 33.7 30.0 - 36.0 g/dL   RDW 78.2 95.6 - 21.3 %   Platelets 261 150 - 400 K/uL    Review of Systems  Constitutional: Negative for fever and chills.  Gastrointestinal: Positive for abdominal pain (occasional cramping ).   Physical Exam   Blood pressure 120/71, pulse 115, temperature 98.9 F (37.2 C), temperature source Oral, resp. rate 18, height  (1.549 m), weight 141 lb (63.957 kg), last menstrual period 10/30/2014.  Physical Exam  Constitutional: She is oriented to person, place, and time. She appears well-developed and well-nourished. No distress.  HENT:  Head: Normocephalic.  Respiratory: Effort normal.  GI: Soft.  Genitourinary:  Bimanual exam: Cervix not felt, fetal parts felt in the vagina. Position of fetus indeterminate  Chaperone present for exam.  Musculoskeletal: Normal range of motion.  Neurological: She is alert and oriented to person, place, and time.  Skin: Skin is warm. She is not diaphoretic.  Psychiatric: Her behavior is normal.    MAU Course  Procedures  None  MDM  CBC Unable to doppler fetal heart tones.  Bedside US done Absent fetal heart tones, anhydramnios now   Assessment and Plan  A:  1. Inevitable abortion   2. Vaginal bleeding in pregnancy   3. Anhydramnios in second trimester, fetus 1    P:  Admit to Ante Cytotec 800 mg buccal  Fentanyl for pain LR @125   Duane LopeJennifer I Rasch, NP 02/13/2015 6:51 PM

## 2015-02-13 NOTE — MAU Note (Signed)
Pt reports she was seen here earlier and was dx with PROM, now with abd pain and some vaginal bleeding.

## 2015-02-13 NOTE — MAU Note (Signed)
Urine sent to lab 

## 2015-02-13 NOTE — Progress Notes (Signed)
Pt is MRSA positive.  Isolation precautions are initiated.

## 2015-02-14 DIAGNOSIS — O039 Complete or unspecified spontaneous abortion without complication: Secondary | ICD-10-CM | POA: Diagnosis present

## 2015-02-14 DIAGNOSIS — Z3A17 17 weeks gestation of pregnancy: Secondary | ICD-10-CM | POA: Diagnosis not present

## 2015-02-14 LAB — CULTURE, OB URINE: Special Requests: NORMAL

## 2015-02-14 MED ORDER — INFLUENZA VAC SPLIT QUAD 0.5 ML IM SUSY
0.5000 mL | PREFILLED_SYRINGE | INTRAMUSCULAR | Status: DC
  Filled 2015-02-14: qty 0.5

## 2015-02-14 MED ORDER — DESOGESTREL-ETHINYL ESTRADIOL 0.15-0.02/0.01 MG (21/5) PO TABS: 1.0000 | ORAL_TABLET | Freq: Every day | ORAL | Status: DC

## 2015-02-14 MED ORDER — MUPIROCIN 2 % EX OINT: 1.0000 "application " | TOPICAL_OINTMENT | Freq: Two times a day (BID) | CUTANEOUS | Status: DC

## 2015-02-14 MED ORDER — METHYLERGONOVINE MALEATE 0.2 MG PO TABS: 0.2000 mg | ORAL_TABLET | Freq: Four times a day (QID) | ORAL | Status: DC

## 2015-02-14 MED ORDER — METHYLERGONOVINE MALEATE 0.2 MG/ML IJ SOLN
0.2000 mg | Freq: Once | INTRAMUSCULAR | Status: AC
  Administered 2015-02-14: 0.2 mg via INTRAMUSCULAR
  Filled 2015-02-14: qty 1

## 2015-02-14 MED ORDER — KETOROLAC TROMETHAMINE 10 MG PO TABS: 10.0000 mg | ORAL_TABLET | Freq: Three times a day (TID) | ORAL | Status: DC | PRN

## 2015-02-14 NOTE — Progress Notes (Signed)
Chaplain paged at 1141. Upon arrive chaplain waited as the unit nurse wished to introduce the chaplain to Ms Derrell LollingIngram. When nurse asked Ms Derrell Lollingngram decline spiritual care. No spiritual assessment, grief assessment and/or grief counsel performed by chaplain. Please page the chaplain at [phone] if Ms Derrell Lollingngram or her family needs or requests spiritual care.  Benjie Karvonenharles D. Chistina Roston, DMin, MDiv Chaplain

## 2015-02-14 NOTE — Progress Notes (Addendum)
IUFD at approximately 2250hrs and placenta followed at approximately 2300hrs.  Placenta appears to be intact with small amount of bleeding.  Pt and family was given the opportunity to hold the fetus.  Pt denies any pain or discomfort after birth, and pt is in good spirits.  Fetus was photographed and care package was provided to pt.  Pastoral care is requested by pt.

## 2015-02-14 NOTE — Discharge Summary (Signed)
Physician Discharge Summary  Patient ID: Maria Orr MRN: [id number] DOB/AGE: 1995/10/11 19 y.o.  Admit date: 02/13/2015 Discharge date: 02/14/2015  Admission Diagnoses: 17 weeks spontaneous loss with PPROM  Discharge Diagnoses:  Active Problems:   Abortion, inevitable   Spontaneous pregnancy loss   Discharged Condition: stable  Hospital Course: pt known to have ROM Presented with pelvic pressure Fetal parts in the vagina and fetus non viable Underwent cytotec and delivered a non viable fetus placenta intact   Consults: None  Significant Diagnostic Studies:   Treatments: cytotec  Discharge Exam: Blood pressure 101/59, pulse 86, temperature 98.8 F (37.1 C), temperature source Oral, resp. rate 16, height 5\' 1"  (1.549 m), weight 138 lb 12.8 oz (62.959 kg), last menstrual period 10/30/2014, SpO2 100 %. General appearance: alert, cooperative and no distress GI: soft, non-tender; bowel sounds normal; no masses,  no organomegaly non tender  Disposition: 01-Home or Self Care  Discharge Instructions    Diet - low sodium heart healthy    Complete by:  As directed      Increase activity slowly    Complete by:  As directed             Medication List    TAKE these medications        desogestrel-ethinyl estradiol 0.15-0.02/0.01 MG (21/5) tablet  Commonly known as:  KARIVA  Take 1 tablet by mouth daily.     ketorolac 10 MG tablet  Commonly known as:  TORADOL  Take 1 tablet (10 mg total) by mouth every 8 (eight) hours as needed.     methylergonovine 0.2 MG tablet  Commonly known as:  METHERGINE  Take 1 tablet (0.2 mg total) by mouth every 6 (six) hours.     mupirocin ointment 2 %  Commonly known as:  BACTROBAN  Place 1 application into the nose 2 (two) times daily.     nystatin cream  Commonly known as:  MYCOSTATIN  Apply 1 application topically 2 (two) times daily.     PRENATAL VITAMINS PLUS 27-1 MG Tabs  Take 1 tablet by mouth daily.            Follow-up Information    [facility] weeks.   Contact information:   [address] [place] [id number] [phone]      Signed: Lazaro ArmsURE,Jordan Caraveo H 02/14/2015, 8:45 AM

## 2015-02-14 NOTE — Progress Notes (Signed)
Pt has decided that the hospital will dispose of the fetus.

## 2015-02-14 NOTE — Discharge Instructions (Signed)
Miscarriage  A miscarriage is the sudden loss of an unborn baby (fetus) before the 20th week of pregnancy. Most miscarriages happen in the first 3 months of pregnancy. Sometimes, it happens before a woman even knows she is pregnant. A miscarriage is also called a "spontaneous miscarriage" or "early pregnancy loss." Having a miscarriage can be an emotional experience. Talk with your caregiver about any questions you may have about miscarrying, the grieving process, and your future pregnancy plans.  CAUSES    Problems with the fetal chromosomes that make it impossible for the baby to develop normally. Problems with the baby's genes or chromosomes are most often the result of errors that occur, by chance, as the embryo divides and grows. The problems are not inherited from the parents.   Infection of the cervix or uterus.    Hormone problems.    Problems with the cervix, such as having an incompetent cervix. This is when the tissue in the cervix is not strong enough to hold the pregnancy.    Problems with the uterus, such as an abnormally shaped uterus, uterine fibroids, or congenital abnormalities.    Certain medical conditions.    Smoking, drinking alcohol, or taking illegal drugs.    Trauma.   Often, the cause of a miscarriage is unknown.   SYMPTOMS    Vaginal bleeding or spotting, with or without cramps or pain.   Pain or cramping in the abdomen or lower back.   Passing fluid, tissue, or blood clots from the vagina.  DIAGNOSIS   Your caregiver will perform a physical exam. You may also have an ultrasound to confirm the miscarriage. Blood or urine tests may also be ordered.  TREATMENT    Sometimes, treatment is not necessary if you naturally pass all the fetal tissue that was in the uterus. If some of the fetus or placenta remains in the body (incomplete miscarriage), tissue left behind may become infected and must be removed. Usually, a dilation and curettage (D and C) procedure is performed.  During a D and C procedure, the cervix is widened (dilated) and any remaining fetal or placental tissue is gently removed from the uterus.   Antibiotic medicines are prescribed if there is an infection. Other medicines may be given to reduce the size of the uterus (contract) if there is a lot of bleeding.   If you have Rh negative blood and your baby was Rh positive, you will need a Rh immunoglobulin shot. This shot will protect any future baby from having Rh blood problems in future pregnancies.  HOME CARE INSTRUCTIONS    Your caregiver may order bed rest or may allow you to continue light activity. Resume activity as directed by your caregiver.   Have someone help with home and family responsibilities during this time.    Keep track of the number of sanitary pads you use each day and how soaked (saturated) they are. Write down this information.    Do not use tampons. Do not douche or have sexual intercourse until approved by your caregiver.    Only take over-the-counter or prescription medicines for pain or discomfort as directed by your caregiver.    Do not take aspirin. Aspirin can cause bleeding.    Keep all follow-up appointments with your caregiver.    If you or your partner have problems with grieving, talk to your caregiver or seek counseling to help cope with the pregnancy loss. Allow enough time to grieve before trying to get pregnant again.     SEEK IMMEDIATE MEDICAL CARE IF:    You have severe cramps or pain in your back or abdomen.   You have a fever.   You pass large blood clots (walnut-sized or larger) ortissue from your vagina. Save any tissue for your caregiver to inspect.    Your bleeding increases.    You have a thick, bad-smelling vaginal discharge.   You become lightheaded, weak, or you faint.    You have chills.   MAKE SURE YOU:   Understand these instructions.   Will watch your condition.   Will get help right away if you are not doing well or get worse.     This  information is not intended to replace advice given to you by your health care provider. Make sure you discuss any questions you have with your health care provider.     Document Released: 09/14/2000 Document Revised: 07/16/2012 Document Reviewed: 05/10/2011  Elsevier Interactive Patient Education 2016 [employer]vier [employer]

## 2015-02-14 NOTE — Progress Notes (Addendum)
Date: February 14, 2015  Time:1415  Discharged to: Home  Discharge instructions given to patient and/or support person: yes  Patient successfully used teach/back to confirm education: yes  Prescriptions sent with patient:no. Rx at pt's pharmacy  Belongings sent with patient:yes  [facility]  [facility] staff assist exit: yes

## 2015-02-16 ENCOUNTER — Encounter (HOSPITAL_COMMUNITY): Payer: Self-pay | Admitting: *Deleted

## 2015-02-18 ENCOUNTER — Encounter (HOSPITAL_COMMUNITY): Payer: Self-pay | Admitting: *Deleted

## 2015-02-18 ENCOUNTER — Inpatient Hospital Stay (HOSPITAL_COMMUNITY)
Admission: AD | Admit: 2015-02-18 | Discharge: 2015-02-18 | Disposition: A | Discharge status: Home / Self Care | Payer: Medicaid Other | Source: Ambulatory Visit | Attending: Obstetrics & Gynecology | Admitting: Obstetrics & Gynecology

## 2015-02-18 DIAGNOSIS — M545 Low back pain: Secondary | ICD-10-CM | POA: Insufficient documentation

## 2015-02-18 DIAGNOSIS — N939 Abnormal uterine and vaginal bleeding, unspecified: Secondary | ICD-10-CM | POA: Insufficient documentation

## 2015-02-18 DIAGNOSIS — R109 Unspecified abdominal pain: Secondary | ICD-10-CM | POA: Diagnosis not present

## 2015-02-18 HISTORY — DX: Personal history of Methicillin resistant Staphylococcus aureus infection: Z86.14

## 2015-02-18 HISTORY — DX: Chlamydial infection, unspecified: A74.9

## 2015-02-18 LAB — URINALYSIS, ROUTINE W REFLEX MICROSCOPIC
GLUCOSE, UA: NEGATIVE mg/dL
KETONES UR: 15 mg/dL — AB
NITRITE: NEGATIVE
PH: 7 (ref 5.0–8.0)
Protein, ur: 30 mg/dL — AB
Specific Gravity, Urine: 1.025 (ref 1.005–1.030)

## 2015-02-18 LAB — URINE MICROSCOPIC-ADD ON

## 2015-02-18 LAB — CBC
HCT: 30.5 % — ABNORMAL LOW (ref 36.0–46.0)
Hemoglobin: 10.1 g/dL — ABNORMAL LOW (ref 12.0–15.0)
MCH: 28.6 pg (ref 26.0–34.0)
MCHC: 33.1 g/dL (ref 30.0–36.0)
MCV: 86.4 fL (ref 78.0–100.0)
Platelets: 198 10*3/uL (ref 150–400)
RBC: 3.53 MIL/uL — ABNORMAL LOW (ref 3.87–5.11)
RDW: 14.7 % (ref 11.5–15.5)
WBC: 10.3 10*3/uL (ref 4.0–10.5)

## 2015-02-18 NOTE — Discharge Instructions (Signed)
Miscarriage °A miscarriage is the loss of an unborn baby (fetus) before the 20th week of pregnancy. The cause is often unknown.  °HOME CARE °· You may need to stay in bed (bed rest), or you may be able to do light activity. Go about activity as told by your doctor. °· Have help at home. °· Write down how many pads you use each day. Write down how soaked they are. °· Do not use tampons. Do not wash out your vagina (douche) or have sex (intercourse) until your doctor approves. °· Only take medicine as told by your doctor. °· Do not take aspirin. °· Keep all doctor visits as told. °· If you or your partner have problems with grieving, talk to your doctor. You can also try counseling. Give yourself time to grieve before trying to get pregnant again. °GET HELP RIGHT AWAY IF: °· You have bad cramps or pain in your back or belly (abdomen). °· You have a fever. °· You pass large clumps of blood (clots) from your vagina that are walnut-sized or larger. Save the clumps for your doctor to see. °· You pass large amounts of tissue from your vagina. Save the tissue for your doctor to see. °· You have more bleeding. °· You have thick, bad-smelling fluid (discharge) coming from the vagina. °· You get lightheaded, weak, or you pass out (faint). °· You have chills. °MAKE SURE YOU: °· Understand these instructions. °· Will watch your condition. °· Will get help right away if you are not doing well or get worse. °  °This information is not intended to replace advice given to you by your health care provider. Make sure you discuss any questions you have with your health care provider. °  °Document Released: 06/13/2011 Document Reviewed: 06/13/2011 °Elsevier Interactive Patient Education ©2016 Elsevier Inc. ° °

## 2015-02-18 NOTE — MAU Note (Signed)
Delivered IUFD 5 days ago. States has continued to have a lot of bleeding with clots. States she is taking Methergine every 8 hours and states she having a lot of back pain, like contractions she is feeling in her back. States she is changing a large pad every 2 hours. Bleeding is light red and pink and clots are dark brown. States she saw Dr. Despina HiddenEure and Dr. Alvester MorinNewton.

## 2015-02-18 NOTE — MAU Provider Note (Signed)
History     CSN: [id number]  Arrival date and time: 02/18/15 1134   First Provider Initiated Contact with Patient 02/18/15 1213      Chief Complaint  Patient presents with  . Vaginal Bleeding   HPI   Maria Orr 19 y.o. G1P0010 status post 17 week IUFD delivered on . Comes to MAU today stating that she passed 2 small blood clots and is having lower back pain and abdominal cramping.  Past Medical History  Diagnosis Date  . 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 Chlamydia   . Hx MRSA infection     Past Surgical History  Procedure Laterality Date  . Surgery r arm Right     Remove blood clots.    Family History  Problem Relation Age of Onset  . Cancer Brother   . Neuropathy Mother     Social History  Substance Use Topics  . Smoking status: Never Smoker   . Smokeless tobacco: Never Used  . Alcohol Use: No    Allergies: No Known Allergies  Prescriptions prior to admission  Medication Sig Dispense Refill Last Dose  . ketorolac (TORADOL) 10 MG tablet Take 1 tablet (10 mg total) by mouth every 8 (eight) hours as needed. (Patient taking differently: Take 10 mg by mouth every 8 (eight) hours as needed for moderate pain. ) 15 tablet 0 02/18/2015 at Unknown time  . methylergonovine (METHERGINE) 0.2 MG tablet Take 1 tablet (0.2 mg total) by mouth every 6 (six) hours. 6 tablet 0 02/18/2015 at Unknown time  . mupirocin ointment (BACTROBAN) 2 % Place 1 application into the nose 2 (two) times daily. 22 g 0 02/18/2015 at Unknown time  . Prenatal Vit-Fe Fumarate-FA (PRENATAL VITAMINS PLUS) 27-1 MG TABS Take 1 tablet by mouth daily. 30 tablet 11 02/18/2015 at Unknown time    Review of Systems  Gastrointestinal: Positive for abdominal pain.  Genitourinary:       Vaginal bleeding  Musculoskeletal: Positive for back pain.  All other systems reviewed and are negative.  Physical Exam   Blood pressure 114/74, pulse 93, temperature 98.3 F (36.8 C),  temperature source Oral, resp. rate 16, height 5\' 1"  (1.549 m), weight 64.864 kg (143 lb), last menstrual period 10/30/2014, unknown if currently breastfeeding.  Physical Exam  Nursing note and vitals reviewed. Constitutional: She is oriented to person, place, and time. She appears well-developed and well-nourished. No distress.  HENT:  Head: Normocephalic and atraumatic.  Neck: Normal range of motion.  Cardiovascular: Normal rate.   Respiratory: Effort normal. No respiratory distress.  GI: Soft. She exhibits no distension. There is no tenderness.  Genitourinary: Vagina normal.  Small amount vaginal bleeding  Musculoskeletal: Normal range of motion.  Neurological: She is alert and oriented to person, place, and time.  Skin: Skin is warm and dry.  Psychiatric: She has a normal mood and affect. Her behavior is normal. Judgment and thought content normal.   Results for orders placed or performed during the hospital encounter of 02/18/15 (from the past 24 hour(s))  Urinalysis, Routine w reflex microscopic (not at [facility])     Status: Abnormal   Collection Time: 02/18/15 11:39 AM  Result Value Ref Range   Color, Urine AMBER (A) YELLOW   APPearance HAZY (A) CLEAR   Specific Gravity, Urine 1.025 1.005 - 1.030   pH 7.0 5.0 - 8.0   Glucose, UA NEGATIVE NEGATIVE mg/dL   Hgb urine dipstick LARGE (A) NEGATIVE   Bilirubin  Urine SMALL (A) NEGATIVE   Ketones, ur 15 (A) NEGATIVE mg/dL   Protein, ur 30 (A) NEGATIVE mg/dL   Nitrite NEGATIVE NEGATIVE   Leukocytes, UA TRACE (A) NEGATIVE  Urine microscopic-add on     Status: Abnormal   Collection Time: 02/18/15 11:39 AM  Result Value Ref Range   Squamous Epithelial / LPF 6-30 (A) NONE SEEN   WBC, UA 0-5 0 - 5 WBC/hpf   RBC / HPF TOO NUMEROUS TO COUNT 0 - 5 RBC/hpf   Bacteria, UA MANY (A) NONE SEEN   Urine-Other MUCOUS PRESENT   CBC     Status: Abnormal   Collection Time: 02/18/15 12:30 PM  Result Value Ref Range   WBC 10.3 4.0 - 10.5 K/uL   RBC  3.53 (L) 3.87 - 5.11 MIL/uL   Hemoglobin 10.1 (L) 12.0 - 15.0 g/dL   HCT 16.1 (L) 09.6 - 04.5 %   MCV 86.4 78.0 - 100.0 fL   MCH 28.6 26.0 - 34.0 pg   MCHC 33.1 30.0 - 36.0 g/dL   RDW 40.9 81.1 - 91.4 %   Platelets 198 150 - 400 K/uL   MAU Course  Procedures  MDM Hgb stable; Bleeding stable. Continue Methergine  Assessment and Plan  Abdominal Cramping Vaginal Bleeding Discharge to home  [facility] 02/18/2015, 12:57 PM

## 2015-02-23 ENCOUNTER — Encounter: Payer: Medicaid Other | Admitting: Family Medicine

## 2015-02-23 ENCOUNTER — Other Ambulatory Visit: Payer: Self-pay | Admitting: Obstetrics & Gynecology

## 2015-02-23 ENCOUNTER — Institutional Professional Consult (permissible substitution): Payer: Self-pay | Admitting: Pediatrics

## 2015-02-23 ENCOUNTER — Telehealth: Payer: Self-pay

## 2015-02-23 NOTE — Telephone Encounter (Signed)
Pt called and stated that she saw in MyChart that her appt was canceled.  Pt stated that she called the front office and was told that she could not get an appt until 03/20/15 and wants to know if she can be seen sooner.

## 2015-02-24 ENCOUNTER — Other Ambulatory Visit: Payer: Self-pay | Admitting: Obstetrics & Gynecology

## 2015-02-24 MED ORDER — METHYLERGONOVINE MALEATE 0.2 MG PO TABS: 0.2000 mg | ORAL_TABLET | Freq: Four times a day (QID) | ORAL | Status: DC

## 2015-02-24 MED ORDER — KETOROLAC TROMETHAMINE 10 MG PO TABS: 10.0000 mg | ORAL_TABLET | Freq: Three times a day (TID) | ORAL | Status: DC | PRN

## 2015-02-25 NOTE — Telephone Encounter (Signed)
Called pt and LM stating that our schedule is currently full.  I recommended that she gives our office a call to see if there are any cancellations for a sooner appt.  If she has any questions to please give us a call back.

## 2015-03-09 ENCOUNTER — Ambulatory Visit (HOSPITAL_COMMUNITY): Admission: RE | Admit: 2015-03-09 | Payer: Medicaid Other | Source: Ambulatory Visit

## 2015-03-09 DIAGNOSIS — Z8619 Personal history of other infectious and parasitic diseases: Secondary | ICD-10-CM | POA: Insufficient documentation

## 2015-03-19 ENCOUNTER — Encounter: Payer: Medicaid Other | Admitting: Certified Nurse Midwife

## 2015-03-20 ENCOUNTER — Ambulatory Visit (INDEPENDENT_AMBULATORY_CARE_PROVIDER_SITE_OTHER): Payer: Medicaid Other | Admitting: Obstetrics & Gynecology

## 2015-03-20 ENCOUNTER — Encounter: Payer: Self-pay | Admitting: Obstetrics & Gynecology

## 2015-03-20 VITALS — BP 128/61 | HR 79 | Temp 98.4°F | Ht 61.0 in | Wt 148.9 lb

## 2015-03-20 DIAGNOSIS — O039 Complete or unspecified spontaneous abortion without complication: Secondary | ICD-10-CM

## 2015-03-20 NOTE — Progress Notes (Signed)
   CLINIC ENCOUNTER NOTE  History:  19 y.o. G1P0010 here today for follow up after delivery of 17 week IUFD after PPROM on 02/14/15. She is doing well.  She denies any abnormal vaginal discharge, bleeding, pelvic pain or other concerns.  Does not want any contraception.  Past Medical History  Diagnosis Date  . 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 Chlamydia   . Hx MRSA infection     Past Surgical History  Procedure Laterality Date  . Surgery r arm Right     Remove blood clots.   The following portions of the patient's history were reviewed and updated as appropriate: allergies, current medications, past family history, past medical history, past social history, past surgical history and problem list.  She has received HPV vaccine series and flu vaccine.   Review of Systems:  Pertinent items noted in HPI and remainder of comprehensive ROS otherwise negative.  Objective:  Physical Exam BP 128/61 mmHg  Pulse 79  Temp(Src) 98.4 F (36.9 C) (Oral)  Ht 5\' 1"  (1.549 m)  Wt 148 lb 14.4 oz (67.541 kg)  BMI 28.15 kg/m2  LMP  (LMP Unknown)  Breastfeeding? No CONSTITUTIONAL: Well-developed, well-nourished female in no acute distress.  HENT:  Normocephalic, atraumatic. External right and left ear normal. Oropharynx is clear and moist EYES: Conjunctivae and EOM are normal. Pupils are equal, round, and reactive to light. No scleral icterus.  NECK: Normal range of motion, supple, no masses SKIN: Skin is warm and dry. No rash noted. Not diaphoretic. No erythema. No pallor. NEUROLGIC: Alert and oriented to person, place, and time. Normal reflexes, muscle tone coordination. No cranial nerve deficit noted. PSYCHIATRIC: Normal mood and affect. Normal behavior. Normal judgment and thought content. CARDIOVASCULAR: Normal heart rate noted RESPIRATORY: Effort and breath sounds normal, no problems with respiration noted ABDOMEN: Soft, no distention noted.   PELVIC:  Deferred MUSCULOSKELETAL: Normal range of motion. No edema noted.  Assessment & Plan:  Spontaneous pregnancy loss Doing well.  Will decide on contraception later. Routine preventative health maintenance measures emphasized. Please refer to After Visit Summary for other counseling recommendations.   Total face-to-face time with patient: 15 minutes. Over 50% of encounter was spent on counseling and coordination of care.   Jaynie CollinsUGONNA  Shatiqua Heroux, MD, FACOG Attending Obstetrician & Gynecologist, [facility]

## 2015-03-20 NOTE — Patient Instructions (Signed)
Return to clinic for any scheduled appointments or for any gynecologic concerns as needed.   

## 2015-03-23 ENCOUNTER — Inpatient Hospital Stay (HOSPITAL_COMMUNITY): Payer: Medicaid Other

## 2015-03-23 ENCOUNTER — Encounter (HOSPITAL_COMMUNITY): Payer: Self-pay | Admitting: *Deleted

## 2015-03-23 ENCOUNTER — Inpatient Hospital Stay (HOSPITAL_COMMUNITY)
Admission: AD | Admit: 2015-03-23 | Discharge: 2015-03-23 | Disposition: A | Discharge status: Home / Self Care | Payer: Medicaid Other | Source: Ambulatory Visit | Attending: Obstetrics and Gynecology | Admitting: Obstetrics and Gynecology

## 2015-03-23 DIAGNOSIS — O034 Incomplete spontaneous abortion without complication: Secondary | ICD-10-CM | POA: Diagnosis not present

## 2015-03-23 LAB — CBC WITH DIFFERENTIAL/PLATELET
Basophils Absolute: 0 10*3/uL (ref 0.0–0.1)
Basophils Relative: 0 %
EOS ABS: 0.1 10*3/uL (ref 0.0–0.7)
Eosinophils Relative: 1 %
HEMATOCRIT: 36.7 % (ref 36.0–46.0)
HEMOGLOBIN: 11.8 g/dL — AB (ref 12.0–15.0)
LYMPHS ABS: 3.4 10*3/uL (ref 0.7–4.0)
Lymphocytes Relative: 38 %
MCH: 28 pg (ref 26.0–34.0)
MCHC: 32.2 g/dL (ref 30.0–36.0)
MCV: 87.2 fL (ref 78.0–100.0)
MONOS PCT: 6 %
Monocytes Absolute: 0.5 10*3/uL (ref 0.1–1.0)
NEUTROS PCT: 55 %
Neutro Abs: 4.8 10*3/uL (ref 1.7–7.7)
Platelets: 301 10*3/uL (ref 150–400)
RBC: 4.21 MIL/uL (ref 3.87–5.11)
RDW: 14.3 % (ref 11.5–15.5)
WBC: 8.8 10*3/uL (ref 4.0–10.5)

## 2015-03-23 LAB — POCT PREGNANCY, URINE: Preg Test, Ur: NEGATIVE

## 2015-03-23 MED ORDER — OXYCODONE-ACETAMINOPHEN 5-325 MG PO TABS: 1.0000 | ORAL_TABLET | Freq: Four times a day (QID) | ORAL | Status: DC | PRN

## 2015-03-23 MED ORDER — MISOPROSTOL 200 MCG PO TABS: ORAL_TABLET | ORAL | Status: DC

## 2015-03-23 MED ORDER — PROMETHAZINE HCL 25 MG PO TABS: 25.0000 mg | ORAL_TABLET | Freq: Four times a day (QID) | ORAL | Status: DC | PRN

## 2015-03-23 NOTE — MAU Note (Signed)
Patient was seen last month for a miscarriage on 11/11, had heavy bleeding with clots today about 30 minutes ago (light pink in color), no cramping. LMP 03/20/15 has never bled like this before. Has had unprotected sex 2 weeks ago.

## 2015-03-23 NOTE — MAU Provider Note (Signed)
History     CSN: [id number]  Arrival date and time: 03/23/15 1624   First Provider Initiated Contact with Patient 03/23/15 1714      Chief Complaint  Patient presents with  . Vaginal Bleeding   HPI Maria Orr is 19 y.o. G1P0010. Patient was seen 11/11 at 17 weeks with PPROM.   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  Patient choose expected management.  Returned later that day with fetal parts presenting in the vagina.  She was treated with Cytotec and admitted.  Discharged 11/12.  Returned to MAU on 11/16 with spotting. Bleeding stopped and she did well until today.  Saw Dr. Macon LargeAnyanwu last Friday (12/16) for follow up.  She was doing well. She started her period later that day.  Became very heavy with "alot of clotting" today.  Denies pain but she was concerned because she never has clotting.  Last sexually active 2 weeks ago.  She is not using anything for contraception.  She doesn't like that OPCs have hormones. She is considering non hormonal IUD or Nexplanon.   Past Medical History  Diagnosis Date  . 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 Chlamydia   . Hx MRSA infection     Past Surgical History  Procedure Laterality Date  . Surgery r arm Right     Remove blood clots.    Family History  Problem Relation Age of Onset  . Cancer Brother   . Neuropathy Mother     Social History  Substance Use Topics  . Smoking status: Never Smoker   . Smokeless tobacco: Never Used  . Alcohol Use: No    Allergies: No Known Allergies  Prescriptions prior to admission  Medication Sig Dispense Refill Last Dose  . Calcium Carbonate-Vitamin D (CALCIUM-VITAMIN D3 PO) Take 1 tablet by mouth daily.   03/23/2015 at Unknown time  . mupirocin ointment (BACTROBAN) 2 % Place 1 application into the nose 2 (two) times daily. 22 g 0 03/23/2015 at Unknown time  . Prenatal Vit-Fe Fumarate-FA (PRENATAL VITAMINS PLUS) 27-1 MG TABS Take 1 tablet by mouth daily. 30 tablet 11  03/23/2015 at Unknown time    Review of Systems  Constitutional: Negative for fever and chills.  Gastrointestinal: Negative for nausea, vomiting and abdominal pain.  Genitourinary: Negative for dysuria, urgency, frequency and hematuria.       + for heavy vaginal bleeding with clots.  Denies pelvic pain.  Musculoskeletal: Negative for back pain.  Neurological: Negative for headaches.   Physical Exam   Blood pressure 125/81, pulse 97, temperature 98.9 F (37.2 C), resp. rate 18, height 5\' 1"  (1.549 m), weight 146 lb (66.225 kg), last menstrual period 03/20/2015.  Physical Exam  Constitutional: She is oriented to person, place, and time. She appears well-developed and well-nourished. No distress.  HENT:  Head: Normocephalic.  Neck: Normal range of motion. Neck supple.  Cardiovascular: Normal rate, regular rhythm and normal heart sounds.   Respiratory: Effort normal and breath sounds normal. No respiratory distress.  GI: Soft. Tenderness: right lower and mid lower   Genitourinary: There is no rash, tenderness or lesion on the right labia. There is no rash, tenderness or lesion on the left labia. Uterus is not enlarged and not tender. Cervix exhibits no motion tenderness, no discharge and no friability. Right adnexum displays no mass, no tenderness and no fullness. Left adnexum displays no mass, no tenderness and no fullness. There is bleeding (small amount of bright red  bleeding with only one small clot.) in the vagina. No vaginal discharge found.  Musculoskeletal: Normal range of motion. She exhibits no edema.  Neurological: She is alert and oriented to person, place, and time.  Skin: Skin is warm and dry.  Psychiatric: She has a normal mood and affect. Her behavior is normal.    BLOOD TYPE A positive  Results for orders placed or performed during the hospital encounter of 03/23/15 (from the past 24 hour(s))  Pregnancy, urine POC     Status: None   Collection Time: 03/23/15  4:53 PM   Result Value Ref Range   Preg Test, Ur NEGATIVE NEGATIVE  CBC with Differential/Platelet     Status: Abnormal   Collection Time: 03/23/15  5:33 PM  Result Value Ref Range   WBC 8.8 4.0 - 10.5 K/uL   RBC 4.21 3.87 - 5.11 MIL/uL   Hemoglobin 11.8 (L) 12.0 - 15.0 g/dL   HCT 40.9 81.1 - 91.4 %   MCV 87.2 78.0 - 100.0 fL   MCH 28.0 26.0 - 34.0 pg   MCHC 32.2 30.0 - 36.0 g/dL   RDW 78.2 95.6 - 21.3 %   Platelets 301 150 - 400 K/uL   Neutrophils Relative % 55 %   Neutro Abs 4.8 1.7 - 7.7 K/uL   Lymphocytes Relative 38 %   Lymphs Abs 3.4 0.7 - 4.0 K/uL   Monocytes Relative 6 %   Monocytes Absolute 0.5 0.1 - 1.0 K/uL   Eosinophils Relative 1 %   Eosinophils Absolute 0.1 0.0 - 0.7 K/uL   Basophils Relative 0 %   Basophils Absolute 0.0 0.0 - 0.1 K/uL   MAU Course  Procedures  MDM MSE Labs Exam U/S Care turned over to [facility]. Mayford Knife, CNM at 18:00  Ultrasound results reviewed:  Show possible retained POCs  US Pelvis Complete  03/23/2015  CLINICAL DATA:  Heaving bleeding with fetal demise at [redacted] weeks gestation EXAM: TRANSABDOMINAL ULTRASOUND OF PELVIS TECHNIQUE: Transabdominal ultrasound examination of the pelvis was performed including evaluation of the uterus, ovaries, adnexal regions, and pelvic cul-de-sac. COMPARISON:  02/13/15 FINDINGS: Uterus Measurements: 8.7 x 4.2 x 4.4 cm. No fibroids or other mass visualized. Endometrium Thickness: 18 mm. The endometrium appears thickened and echogenic with increased blood flow. Right ovary Not visualized Left ovary Measurements: 5 x 4 x 3.4 cm. Approximately 2 cm simple cyst Other findings:  No abnormal free fluid. IMPRESSION: Findings concerning for retained products of conception. Electronically Signed   By: Esperanza Heir M.D.   On: 03/23/2015 18:42   Consulted Dr Jolayne Panther who recommends vaginal Cytotec x 1 dose then followup in clinic in 2 weeks.   Assessment and Plan  A:  Vaginal bleeding       Stable/improved hemoglobin level        Negative Pregnancy Test      Recent Intrauterine Fetal Demise at [redacted] weeks gestation.      Retained products of conception   P:  DIscharge home       Rx Cytotec x PV for retained POC       Rx Percocet and Phenergan for pain/nausea        F/U in clinic 2 weeks. Message sent.        Bleeding precautions  KEY,EVE M 03/23/2015, 5:44 PM

## 2015-03-23 NOTE — Discharge Instructions (Signed)
Incomplete Miscarriage A miscarriage is the sudden loss of an unborn baby (fetus) before the 20th week of pregnancy. In an incomplete miscarriage, parts of the fetus or placenta (afterbirth) remain in the body.  Having a miscarriage can be an emotional experience. Talk with your health care provider about any questions you may have about miscarrying, the grieving process, and your future pregnancy plans. CAUSES   Problems with the fetal chromosomes that make it impossible for the baby to develop normally. Problems with the baby's genes or chromosomes are most often the result of errors that occur by chance as the embryo divides and grows. The problems are not inherited from the parents.  Infection of the cervix or uterus.  Hormone problems.  Problems with the cervix, such as having an incompetent cervix. This is when the tissue in the cervix is not strong enough to hold the pregnancy.  Problems with the uterus, such as an abnormally shaped uterus, uterine fibroids, or congenital abnormalities.  Certain medical conditions.  Smoking, drinking alcohol, or taking illegal drugs.  Trauma. SYMPTOMS   Vaginal bleeding or spotting, with or without cramps or pain.  Pain or cramping in the abdomen or lower back.  Passing fluid, tissue, or blood clots from the vagina. DIAGNOSIS  Your health care provider will perform a physical exam. You may also have an ultrasound to confirm the miscarriage. Blood or urine tests may also be ordered. TREATMENT   Usually, a dilation and curettage (D&C) procedure is performed. During a D&C procedure, the cervix is widened (dilated) and any remaining fetal or placental tissue is gently removed from the uterus.  Antibiotic medicines are prescribed if there is an infection. Other medicines may be given to reduce the size of the uterus (contract) if there is a lot of bleeding.  If you have Rh negative blood and your baby was Rh positive, you will need a Rho (D)  immune globulin shot. This shot will protect any future baby from having Rh blood problems in future pregnancies.  You may be confined to bed rest. This means you should stay in bed and only get up to use the bathroom. HOME CARE INSTRUCTIONS   Rest as directed by your health care provider.  Restrict activity as directed by your health care provider. You may be allowed to continue light activity if curettage was not done but you require further treatment.  Keep track of the number of pads you use each day. Keep track of how soaked (saturated) they are. Record this information.  Do not  use tampons.  Do not douche or have sexual intercourse until approved by your health care provider.  Keep all follow-up appointments for reevaluation and continuing management.  Only take over-the-counter or prescription medicines for pain, fever, or discomfort as directed by your health care provider.  Take antibiotic medicine as directed by your health care provider. Make sure you finish it even if you start to feel better. SEEK IMMEDIATE MEDICAL CARE IF:   You experience severe cramps in your stomach, back, or abdomen.  You have an unexplained temperature (make sure to record these temperatures).  You pass large clots or tissue (save these for your health care provider to inspect).  Your bleeding increases.  You become light-headed, weak, or have fainting episodes. MAKE SURE YOU:   Understand these instructions.  Will watch your condition.  Will get help right away if you are not doing well or get worse.   This information is not intended to   replace advice given to you by your health care provider. Make sure you discuss any questions you have with your health care provider.   Document Released: 03/21/2005 Document Revised: 04/11/2014 Document Reviewed: 10/18/2012 Elsevier Interactive Patient Education 2016 Elsevier Inc.  

## 2015-03-23 NOTE — MAU Note (Signed)
Urine sent to lab 

## 2015-03-24 LAB — GC/CHLAMYDIA PROBE AMP (~~LOC~~) NOT AT ARMC
Chlamydia: NEGATIVE
Neisseria Gonorrhea: NEGATIVE

## 2015-04-01 DIAGNOSIS — O42919 Preterm premature rupture of membranes, unspecified as to length of time between rupture and onset of labor, unspecified trimester: Secondary | ICD-10-CM | POA: Insufficient documentation

## 2015-04-13 ENCOUNTER — Encounter: Payer: Medicaid Other | Admitting: Obstetrics and Gynecology

## 2015-04-23 DIAGNOSIS — Q279 Congenital malformation of peripheral vascular system, unspecified: Secondary | ICD-10-CM | POA: Insufficient documentation

## 2015-11-21 ENCOUNTER — Inpatient Hospital Stay (HOSPITAL_COMMUNITY): Payer: Medicaid Other

## 2015-11-21 ENCOUNTER — Encounter (HOSPITAL_COMMUNITY): Payer: Self-pay | Admitting: *Deleted

## 2015-11-21 ENCOUNTER — Inpatient Hospital Stay (HOSPITAL_COMMUNITY)
Admission: AD | Admit: 2015-11-21 | Discharge: 2015-11-21 | Disposition: A | Discharge status: Home / Self Care | Payer: Medicaid Other | Source: Ambulatory Visit | Attending: Obstetrics and Gynecology | Admitting: Obstetrics and Gynecology

## 2015-11-21 DIAGNOSIS — Z8619 Personal history of other infectious and parasitic diseases: Secondary | ICD-10-CM | POA: Insufficient documentation

## 2015-11-21 DIAGNOSIS — O26891 Other specified pregnancy related conditions, first trimester: Secondary | ICD-10-CM | POA: Insufficient documentation

## 2015-11-21 DIAGNOSIS — Z3201 Encounter for pregnancy test, result positive: Secondary | ICD-10-CM | POA: Diagnosis not present

## 2015-11-21 DIAGNOSIS — R102 Pelvic and perineal pain: Secondary | ICD-10-CM | POA: Diagnosis not present

## 2015-11-21 DIAGNOSIS — O26899 Other specified pregnancy related conditions, unspecified trimester: Secondary | ICD-10-CM

## 2015-11-21 DIAGNOSIS — O009 Unspecified ectopic pregnancy without intrauterine pregnancy: Secondary | ICD-10-CM | POA: Diagnosis not present

## 2015-11-21 DIAGNOSIS — R109 Unspecified abdominal pain: Secondary | ICD-10-CM

## 2015-11-21 LAB — CREATININE, SERUM: CREATININE: 0.66 mg/dL (ref 0.44–1.00)

## 2015-11-21 LAB — CBC
HEMATOCRIT: 37.5 % (ref 36.0–46.0)
HEMOGLOBIN: 12.9 g/dL (ref 12.0–15.0)
MCH: 28.9 pg (ref 26.0–34.0)
MCHC: 34.4 g/dL (ref 30.0–36.0)
MCV: 83.9 fL (ref 78.0–100.0)
Platelets: 313 10*3/uL (ref 150–400)
RBC: 4.47 MIL/uL (ref 3.87–5.11)
RDW: 14.8 % (ref 11.5–15.5)
WBC: 9.9 10*3/uL (ref 4.0–10.5)

## 2015-11-21 LAB — URINE MICROSCOPIC-ADD ON

## 2015-11-21 LAB — URINALYSIS, ROUTINE W REFLEX MICROSCOPIC
BILIRUBIN URINE: NEGATIVE
Glucose, UA: NEGATIVE mg/dL
Ketones, ur: NEGATIVE mg/dL
Leukocytes, UA: NEGATIVE
NITRITE: NEGATIVE
PROTEIN: NEGATIVE mg/dL
Specific Gravity, Urine: 1.025 (ref 1.005–1.030)
pH: 6 (ref 5.0–8.0)

## 2015-11-21 LAB — WET PREP, GENITAL
Clue Cells Wet Prep HPF POC: NONE SEEN
Sperm: NONE SEEN
Trich, Wet Prep: NONE SEEN
Yeast Wet Prep HPF POC: NONE SEEN

## 2015-11-21 LAB — BUN: BUN: 6 mg/dL (ref 6–20)

## 2015-11-21 LAB — POCT PREGNANCY, URINE: PREG TEST UR: POSITIVE — AB

## 2015-11-21 LAB — HCG, QUANTITATIVE, PREGNANCY: hCG, Beta Chain, Quant, S: 1167 m[IU]/mL — ABNORMAL HIGH (ref <5)

## 2015-11-21 LAB — AST: AST: 20 U/L (ref 15–41)

## 2015-11-21 MED ORDER — PROMETHAZINE HCL 25 MG PO TABS: 12.5000 mg | ORAL_TABLET | Freq: Four times a day (QID) | ORAL | 0 refills | Status: DC | PRN

## 2015-11-21 MED ORDER — METHOTREXATE INJECTION FOR WOMEN'S HOSPITAL
50.0000 mg/m2 | Freq: Once | INTRAMUSCULAR | Status: AC
  Administered 2015-11-21: 85 mg via INTRAMUSCULAR
  Filled 2015-11-21: qty 1.7

## 2015-11-21 MED ORDER — OXYCODONE-ACETAMINOPHEN 5-325 MG PO TABS: 1.0000 | ORAL_TABLET | Freq: Four times a day (QID) | ORAL | 0 refills | Status: DC | PRN

## 2015-11-21 NOTE — MAU Note (Signed)
No period since June 29th,  Noticed lower pelvic pain that is on different side of lower pelvic area since 11/14/15,, Also noticed leg and feet pain with lots of walking

## 2015-11-21 NOTE — Discharge Instructions (Signed)
Will need follow up for BHCG on Tuesday August 22 (Day 4) and on Friday August 25 (Day 7) Pelvic rest - no sex, no tampons, no douching. Do not take prenatal vitamins. No heavy lifting or extreme exercise. Return sooner here or to another ER if develops severe abdominal pain or heavy vaginal bleeding.

## 2015-11-21 NOTE — MAU Provider Note (Signed)
History     CSN: [id number]  Arrival date and time: 11/21/15 1646   First Provider Initiated Contact with Patient 11/21/15 1712      Chief Complaint  Patient presents with  . Pelvic Pain   HPI Maria Orr 20 y.o. Comes to MAU with periodic lower abdominal pain for several days.  No vaginal bleeding.  Was surprised by the positive pregnancy test but is happy to be pregnant.  Previous pregnancy was a 17 week IUFD arter PPROM on 02-14-15.  Went to prenatal care previously at [facility] and plans to make an appointment there again.  Currently goes to college in [place] and is home for the weekend.    OB History    Gravida Para Term Preterm AB Living   2 0     1 0   SAB TAB Ectopic Multiple Live Births                  Past Medical History:  Diagnosis Date  . Chlamydia   . Hx MRSA infection   . 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 Surgery R arm Right    Remove blood clots.    Family History  Problem Relation Age of Onset  . Neuropathy Mother   . Cancer Brother     Social History  Substance Use Topics  . Smoking status: Never Smoker  . Smokeless tobacco: Never Used  . Alcohol use No    Allergies: No Known Allergies  Prescriptions Prior to Admission  Medication Sig Dispense Refill Last Dose  . Calcium Carbonate-Vitamin D (CALCIUM-VITAMIN D3 PO) Take 1 tablet by mouth daily.   03/23/2015 at Unknown time  . misoprostol (CYTOTEC) 200 MCG tablet Insert four tablets vaginally all at once 4 tablet 1   . mupirocin ointment (BACTROBAN) 2 % Place 1 application into the nose 2 (two) times daily. 22 g 0 03/23/2015 at Unknown time  . oxyCODONE-acetaminophen (PERCOCET/ROXICET) 5-325 MG tablet Take 1-2 tablets by mouth every 6 (six) hours as needed. 15 tablet 0   . Prenatal Vit-Fe Fumarate-FA (PRENATAL VITAMINS PLUS) 27-1 MG TABS Take 1 tablet by mouth daily. 30 tablet 11 03/23/2015  at Unknown time  . promethazine (PHENERGAN) 25 MG tablet Take 1 tablet (25 mg total) by mouth every 6 (six) hours as needed for nausea or vomiting. 20 tablet 0     Review of Systems  Constitutional: Negative for fever.  Gastrointestinal: Positive for abdominal pain. Negative for constipation, diarrhea, nausea and vomiting.  Genitourinary:       No vaginal discharge. No vaginal bleeding. No dysuria.   Physical Exam   Blood pressure 132/74, pulse 93, temperature 98.9 F (37.2 C), temperature source Oral, resp. rate 18, height 5\' 1"  (1.549 m), weight 149 lb (67.6 kg), last menstrual period 10/01/2015.  Physical Exam  Nursing note and vitals reviewed. Constitutional: She is oriented to person, place, and time. She appears well-developed and well-nourished. No distress.  HENT:  Head: Normocephalic.  Eyes: EOM are normal.  Neck: Neck supple.  Respiratory: Effort normal.  Genitourinary:  Genitourinary Comments: Dr. Alysia Penna performed bimanual exam and GC/Chlam DNA collected.  Musculoskeletal: Normal range of motion.  Neurological: She is alert and oriented to person, place, and time.  Skin: Skin is warm and dry.  Psychiatric: She has a normal mood and affect.    MAU Course  Procedures Results for orders  placed or performed during the hospital encounter of 11/21/15 (from the past 24 hour(s))  Urinalysis, Routine w reflex microscopic ([facility])     Status: Abnormal   Collection Time: 11/21/15  4:53 PM  Result Value Ref Range   Color, Urine YELLOW YELLOW   APPearance CLEAR CLEAR   Specific Gravity, Urine 1.025 1.005 - 1.030   pH 6.0 5.0 - 8.0   Glucose, UA NEGATIVE NEGATIVE mg/dL   Hgb urine dipstick TRACE (A) NEGATIVE   Bilirubin Urine NEGATIVE NEGATIVE   Ketones, ur NEGATIVE NEGATIVE mg/dL   Protein, ur NEGATIVE NEGATIVE mg/dL   Nitrite NEGATIVE NEGATIVE   Leukocytes, UA NEGATIVE NEGATIVE  Urine microscopic-add on     Status: Abnormal   Collection Time: 11/21/15  4:53 PM   Result Value Ref Range   Squamous Epithelial / LPF 0-5 (A) NONE SEEN   WBC, UA 0-5 0 - 5 WBC/hpf   RBC / HPF 0-5 0 - 5 RBC/hpf   Bacteria, UA RARE (A) NONE SEEN  Pregnancy, urine POC     Status: Abnormal   Collection Time: 11/21/15  5:07 PM  Result Value Ref Range   Preg Test, Ur POSITIVE (A) NEGATIVE  CBC     Status: None   Collection Time: 11/21/15  5:16 PM  Result Value Ref Range   WBC 9.9 4.0 - 10.5 K/uL   RBC 4.47 3.87 - 5.11 MIL/uL   Hemoglobin 12.9 12.0 - 15.0 g/dL   HCT 69.637.5 29.536.0 - 28.446.0 %   MCV 83.9 78.0 - 100.0 fL   MCH 28.9 26.0 - 34.0 pg   MCHC 34.4 30.0 - 36.0 g/dL   RDW 13.214.8 44.011.5 - 10.215.5 %   Platelets 313 150 - 400 K/uL  hCG, quantitative, pregnancy     Status: Abnormal   Collection Time: 11/21/15  5:16 PM  Result Value Ref Range   hCG, Beta Chain, Quant, S 1,167 (H) <5 mIU/mL   CLINICAL DATA:  Abdominal pain and pregnancy. Seven weeks 2 days gestation.  EXAM: OBSTETRIC <14 WK US AND TRANSVAGINAL OB US  TECHNIQUE: Both transabdominal and transvaginal ultrasound examinations were performed for complete evaluation of the gestation as well as the maternal uterus, adnexal regions, and pelvic cul-de-sac. Transvaginal technique was performed to assess early pregnancy.  COMPARISON:  Pelvic ultrasound 03/23/2015.  FINDINGS: Intrauterine gestational sac: Absent  Yolk sac:  Absent  Embryo:  Absent  Maternal uterus/adnexae: A centrally anechoic "Mass" structure contacting the left side of the uterus measures 1.5 x 1.9 x 2.2 cm. This has a morphology which strongly favors gestational sac. The presumed sac measures 0.7 x 0.5 x 1.4 cm.  No significant free fluid.  IMPRESSION: Findings which are highly suspicious for ectopic pregnancy positioned along the left side of the uterus. This may be isthmic in position. Critical test results telephoned to.Dr. Marvetta GibbonsBurleson. at the time of interpretation at . 6:18 p.m.on 11/21/2015.   MDM Client had ultrasound  prior to pelvic exam.  Preliminary ultrasound report shows ectopic pregnancy.  Radiologist called to confirm ectopic pregnancy diagnosis.  Dr. Alysia PennaErvin notified of ultrasound results.  Dr. Alysia PennaErvin in to see patient and discussed options of treatment for ectopic pregnancy - surgery and methotrexate.  Client discussed with her mother.  Decided to treat with methotrexate.  Labs pending.  [facility].    Assessment and Plan  Ectopic pregnancy  Plan Methotrexate today if labs are normal. Will need follow up for BHCG on  Tuesday August 22 (Day 4) and on Friday August 25 (Day 7) Pelvic rest - no sex, no tampons, no douching. Do not take prenatal vitamins. No heavy lifting or extreme exercise. Return sooner here or to another ER if develops severe abdominal pain or heavy vaginal bleeding.  Terri L Burleson 11/21/2015, 5:41 PM   Addendum:  Reviewed labs prior to MTX injection and labs wnl.  MTX injection given.  Rx for Percocet 5/325, take 1-2 tabs Q 6 hours PRN x 6 tabs and Phenergan 12.5-25 mg Q 6 hours PRN printed and given to pt at time of discharge.  Warning signs/reasons to return to hospital reviewed.  Pt to f/u on Day 4 and Day 7 in MAU.    LEFTWICH-KIRBY, LISA, CNM 8:36 PM

## 2015-11-22 LAB — HIV ANTIBODY (ROUTINE TESTING W REFLEX): HIV Screen 4th Generation wRfx: NONREACTIVE

## 2015-11-22 LAB — RPR: RPR Ser Ql: NONREACTIVE

## 2015-11-23 LAB — GC/CHLAMYDIA PROBE AMP (~~LOC~~) NOT AT ARMC
Chlamydia: NEGATIVE
NEISSERIA GONORRHEA: NEGATIVE

## 2015-11-24 ENCOUNTER — Other Ambulatory Visit: Payer: Medicaid Other

## 2015-11-24 DIAGNOSIS — O3680X0 Pregnancy with inconclusive fetal viability, not applicable or unspecified: Secondary | ICD-10-CM

## 2015-11-24 LAB — HCG, QUANTITATIVE, PREGNANCY: HCG, BETA CHAIN, QUANT, S: 319.7 m[IU]/mL — AB

## 2015-11-25 ENCOUNTER — Telehealth: Payer: Self-pay | Admitting: *Deleted

## 2015-11-25 NOTE — Telephone Encounter (Signed)
Pt left message requesting her test results because they are not on MyChart.  She wants to know if her hormone levels are down.  I called pt and informed her of hormone level from yesterday which has decreased significantly. Per discharge plan from MAU, she will need to return for repeat BHCG on 8/25.  Pt voiced understanding and agreed to appt @ 0800.

## 2015-11-27 ENCOUNTER — Other Ambulatory Visit: Payer: Medicaid Other

## 2015-11-27 ENCOUNTER — Encounter: Payer: Self-pay | Admitting: Obstetrics and Gynecology

## 2015-11-27 DIAGNOSIS — O039 Complete or unspecified spontaneous abortion without complication: Secondary | ICD-10-CM

## 2015-11-28 LAB — HCG, QUANTITATIVE, PREGNANCY: HCG, BETA CHAIN, QUANT, S: 111.2 m[IU]/mL — AB

## 2015-12-01 ENCOUNTER — Telehealth: Payer: Self-pay | Admitting: *Deleted

## 2015-12-01 NOTE — Telephone Encounter (Signed)
Pt left message yesterday requesting results of hormone level.   I called her back and left a message requesting her to call back and state whether a detailed message can be left on her voice mail. Pt needs to be informed that her hormone level has gone down. Per Dr. Vergie LivingPickens, pt needs to continue weekly lab draw for BHCG until it is <5. She will also need f/u appt w/provider within the next 2 weeks.

## 2015-12-10 NOTE — Telephone Encounter (Signed)
Call patient again no answer or voicemail to leave a message concerning patient coming back in for appointment.

## 2015-12-10 NOTE — Telephone Encounter (Signed)
Will send a letter to patient.

## 2015-12-16 ENCOUNTER — Telehealth: Payer: Self-pay | Admitting: *Deleted

## 2015-12-16 NOTE — Telephone Encounter (Signed)
Pt left message stating that she had received a letter from our office. She is calling back for her results. I returned the call and left message stating that we can leave a detailed message on her voice mail with her permission. Please call back and state whether this will be ok.

## 2015-12-19 ENCOUNTER — Inpatient Hospital Stay (HOSPITAL_COMMUNITY)
Admission: AD | Admit: 2015-12-19 | Discharge: 2015-12-19 | Disposition: A | Discharge status: Home / Self Care | Payer: Medicaid Other | Source: Ambulatory Visit | Attending: Obstetrics & Gynecology | Admitting: Obstetrics & Gynecology

## 2015-12-19 DIAGNOSIS — O009 Unspecified ectopic pregnancy without intrauterine pregnancy: Secondary | ICD-10-CM | POA: Insufficient documentation

## 2015-12-19 DIAGNOSIS — Z09 Encounter for follow-up examination after completed treatment for conditions other than malignant neoplasm: Secondary | ICD-10-CM

## 2015-12-19 LAB — HCG, QUANTITATIVE, PREGNANCY: hCG, Beta Chain, Quant, S: 1 m[IU]/mL (ref <5)

## 2015-12-19 NOTE — MAU Provider Note (Signed)
S:  [name] is a 20 y.o. female G2P0020, diagnosed with an ectopic pregnancy on 8/19. She is here tonight for advice. She received methotrexate on 8/19 and was followed in the [facility] for day #4 labs and day #7 days. She states she received a letter in the mail from the [facility] following her last blood work that she needed to contact them immediatly. She tried contacting them, however was never able to get through. She is here tonight to try and determine what further follow up she needs.   O:  GENERAL: Well-developed, well-nourished female in no acute distress.  LUNGS: Effort normal SKIN: Warm, dry and without erythema PSYCH: Normal mood and affect  Vitals:   12/19/15 2207  BP: 119/67  Pulse: 77  Resp: 16  Temp: 97.6 F (36.4 C)   Results for orders placed or performed during the hospital encounter of 12/19/15 (from the past 24 hour(s))  hCG, quantitative, pregnancy     Status: None   Collection Time: 12/19/15 10:12 PM  Result Value Ref Range   hCG, Beta Chain, Quant, S 1 <5 mIU/mL    MDM:   Beta Hcg 8/19: 1167 Beta Hcg 8/22 Day #4: 319 Beta Hcg 8/25 Day # 7: 111 Beta Hcg 9/17: 1   A:  1. Ectopic pregnancy   2. Follow up     P:  Discharge home in stable condition Return to MAU as needed Follow up with the [facility] as needed  Duane LopeJennifer I Alvan Culpepper, NP 12/19/2015 12:19 AM

## 2015-12-19 NOTE — MAU Note (Signed)
Patient presents to mau for repeat lab work. Denies vaginal bleeding discharge or pain at this time.

## 2015-12-19 NOTE — Discharge Instructions (Signed)
Ectopic Pregnancy °An ectopic pregnancy happens when a fertilized egg grows outside the uterus. A pregnancy cannot live outside of the uterus. This problem often happens in the fallopian tube. It is often caused by damage to the fallopian tube. °If this problem is found early, you may be treated with medicine. If your tube tears or bursts open (ruptures), you will bleed inside. This is an emergency. You will need surgery. Get help right away.  °SYMPTOMS °You may have normal pregnancy symptoms at first. These include: °· Missing your period. °· Feeling sick to your stomach (nauseous). °· Being tired. °· Having tender breasts. °Then, you may start to have symptoms that are not normal. These include: °· Pain with sex (intercourse). °· Bleeding from the vagina. This includes light bleeding (spotting). °· Belly (abdomen) or lower belly cramping or pain. This may be felt on one side. °· A fast heartbeat (pulse). °· Passing out (fainting) after going poop (bowel movement). °If your tube tears, you may have symptoms such as: °· Really bad pain in the belly or lower belly. This happens suddenly. °· Dizziness. °· Passing out. °· Shoulder pain. °GET HELP RIGHT AWAY IF:  °You have any of these symptoms. This is an emergency. °MAKE SURE YOU: °· Understand these instructions. °· Will watch your condition. °· Will get help right away if you are not doing well or get worse. °  °This information is not intended to replace advice given to you by your health care provider. Make sure you discuss any questions you have with your health care provider. °  °Document Released: 06/17/2008 Document Revised: 03/26/2013 Document Reviewed: 10/31/2012 °Elsevier Interactive Patient Education ©2016 Elsevier Inc. ° °

## 2015-12-21 NOTE — Telephone Encounter (Signed)
Patient was seen in the MAU for lab results.

## 2016-09-27 IMAGING — US US OB COMP LESS 14 WK
1 series · 15 of 28 positions shown · non-contrast
Comparison: None.

CLINICAL DATA: Intermittent pelvic pain.  Spotting.

EXAM:
OBSTETRIC <14 WK ULTRASOUND
TECHNIQUE: Transabdominal ultrasound was performed for evaluation of the
gestation as well as the maternal uterus and adnexal regions.

[Series 1: us ob comp less 14 wk · 15 of 31 slices shown]
[im 1/31]
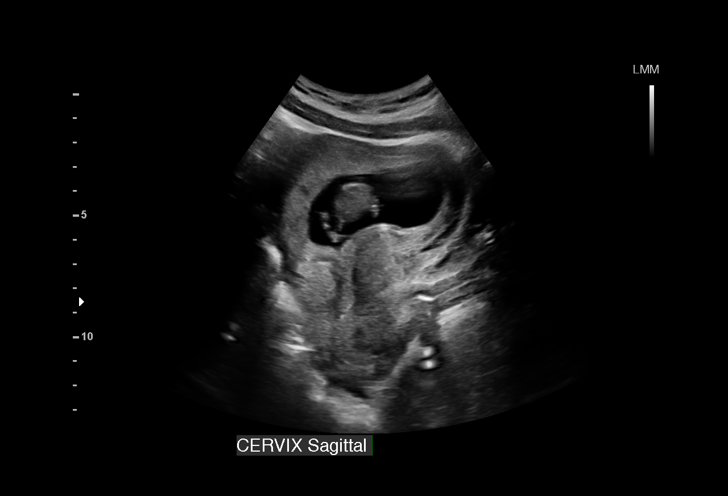
[im 3/31]
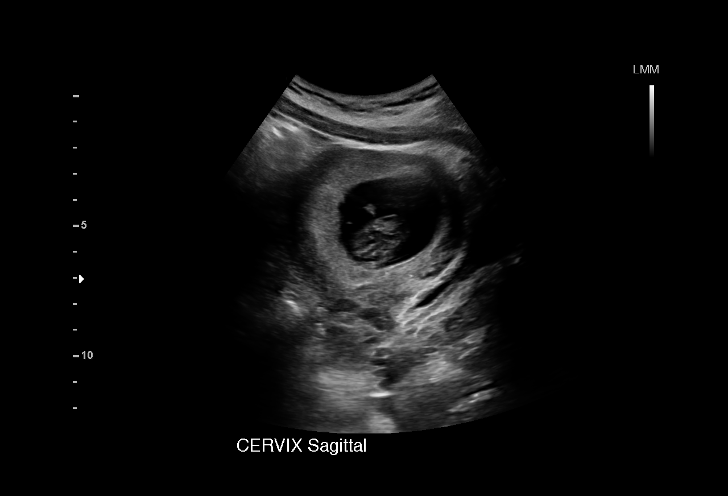
[im 5/31]
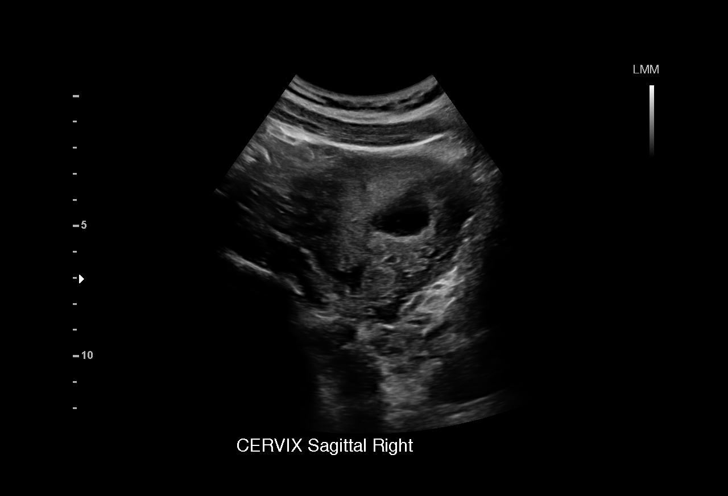
[im 7/31]
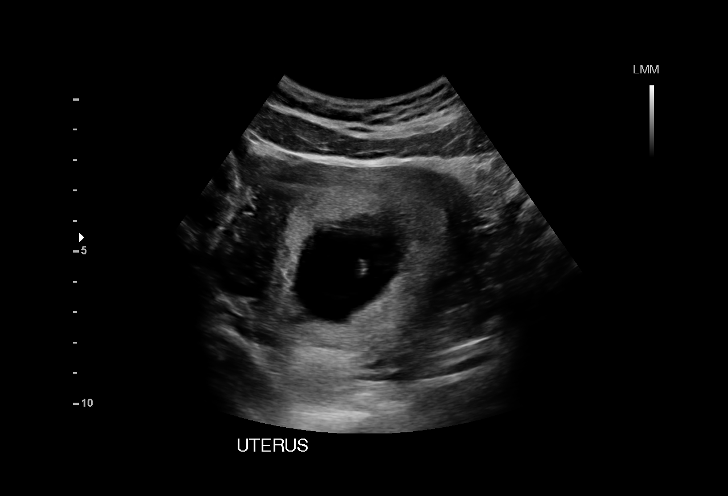
[im 9/31]
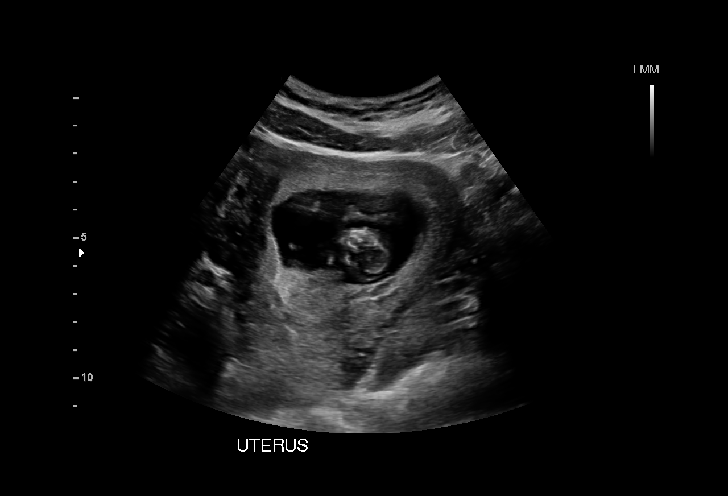
[im 12/31]
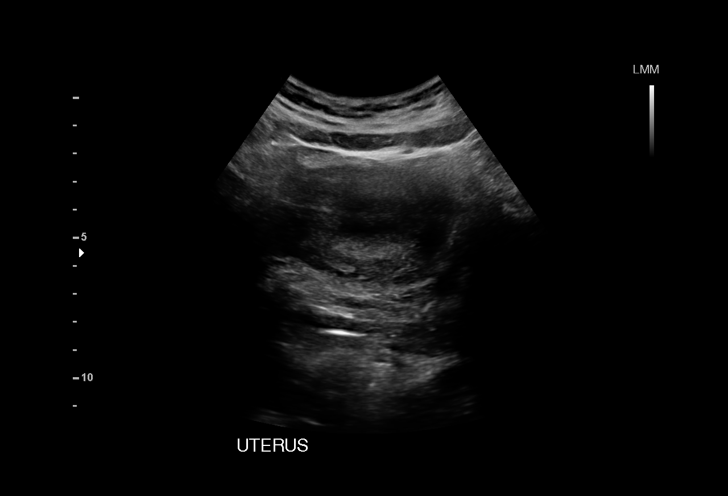
[im 14/31]
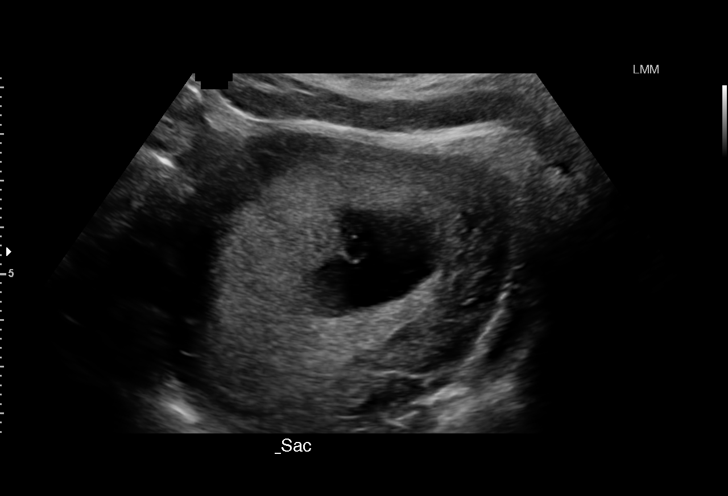
[im 16/31]
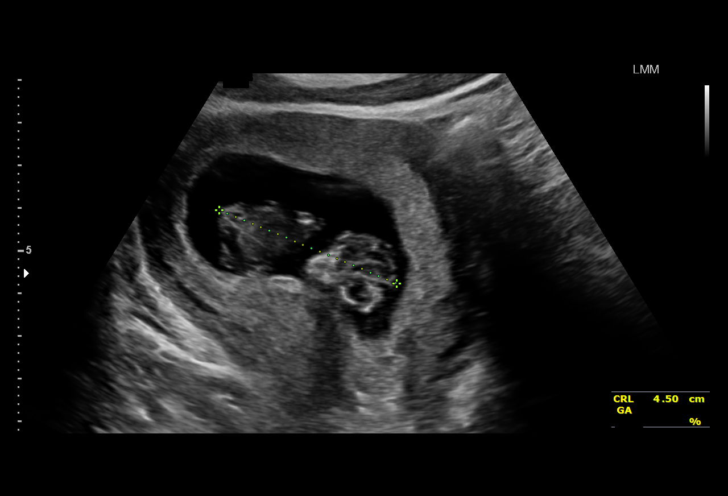
[im 17/31]
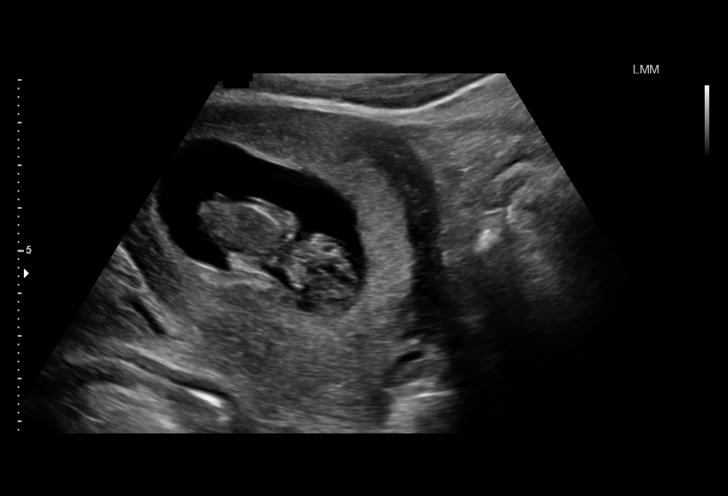
[im 19/31]
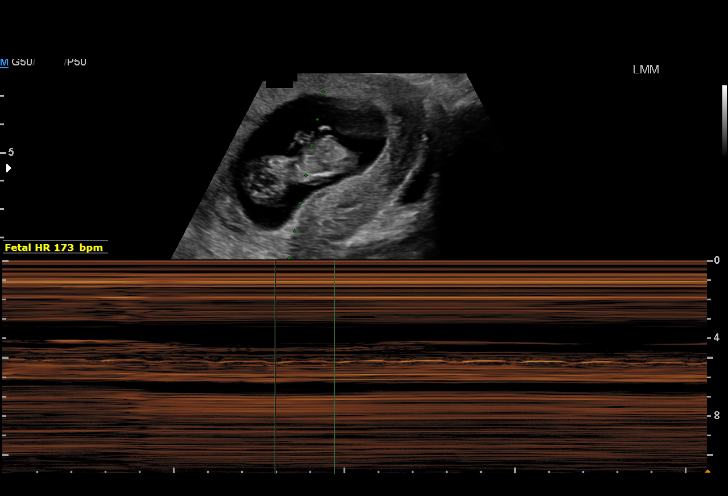
[im 22/31]
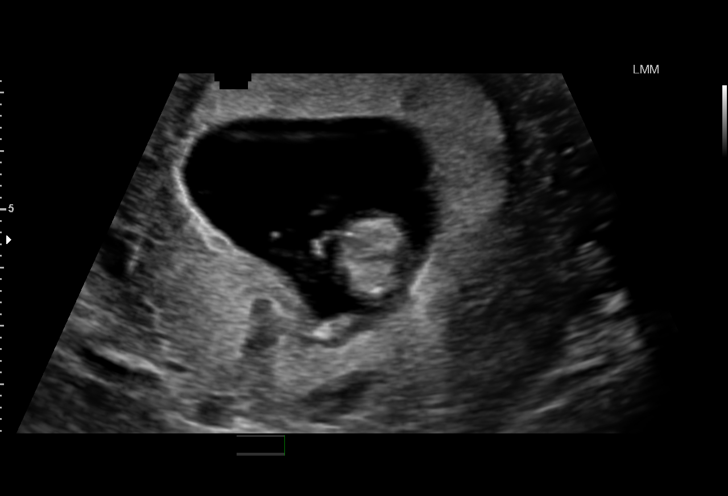
[im 24/31]
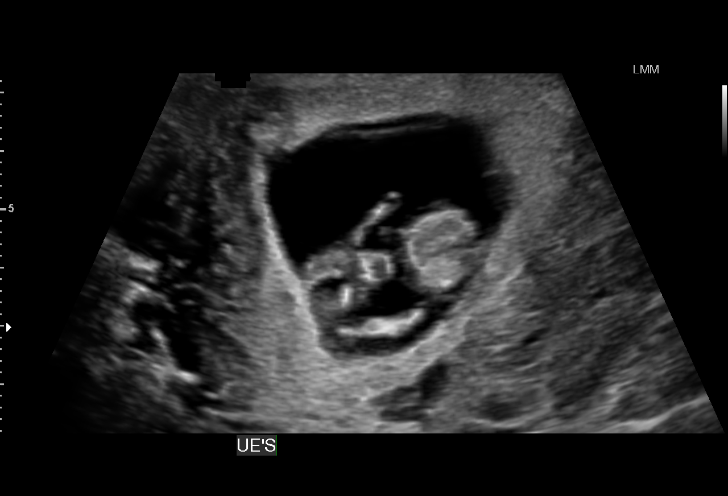
[im 26/31]
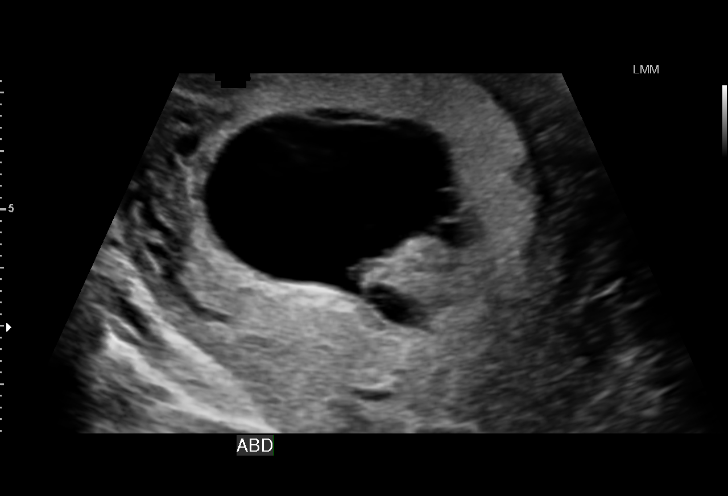
[im 28/31]
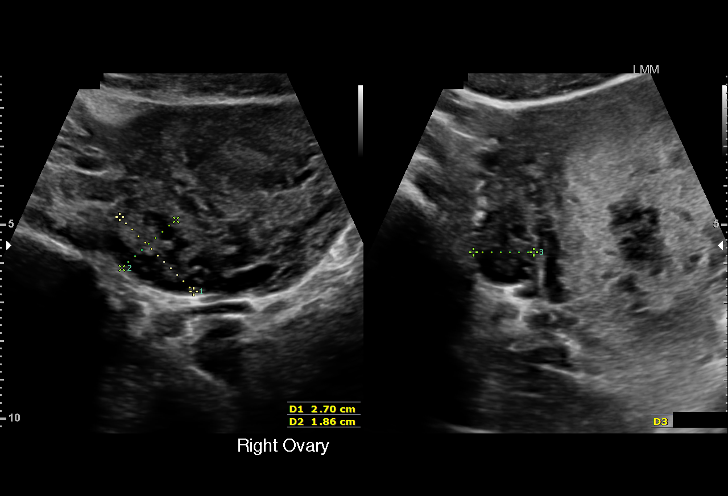
[im 31/31]
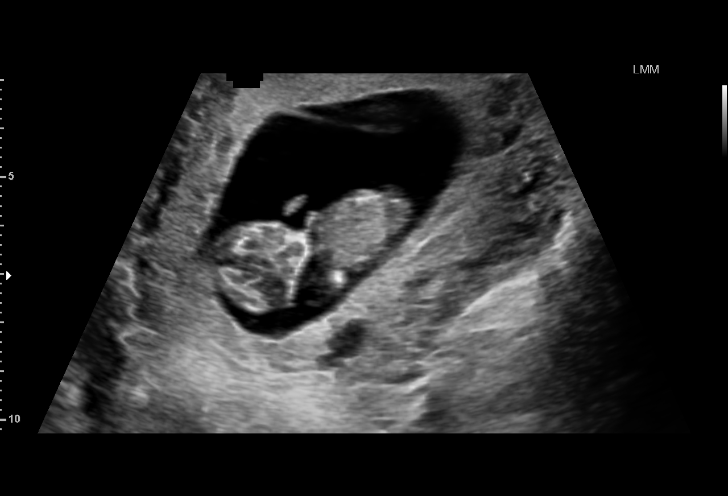

[15 of 28 positions shown; findings below may reference images not displayed]

FINDINGS: Intrauterine gestational sac: Visualized/normal in shape. No
subchorionic hematoma.

Yolk sac:  Present

Embryo:  Present

Cardiac Activity: Present

Heart Rate: 173 bpm

CRL:   44  mm   11 w 1 d                  US EDC: 07/25/2015

Maternal uterus/adnexae: Physiologic appearance of the ovaries. No
free pelvic fluid.
IMPRESSION: Single intrauterine gestation measuring 11 weeks 1 day. No
pathologic findings.

## 2016-11-06 IMAGING — US US MFM OB LIMITED
1 series · 16 of 22 positions shown · non-contrast
Comparison: none

[Series 1: us mfm ob limited · 22 acquisitions, 16 frames shown]
[im 1/22]
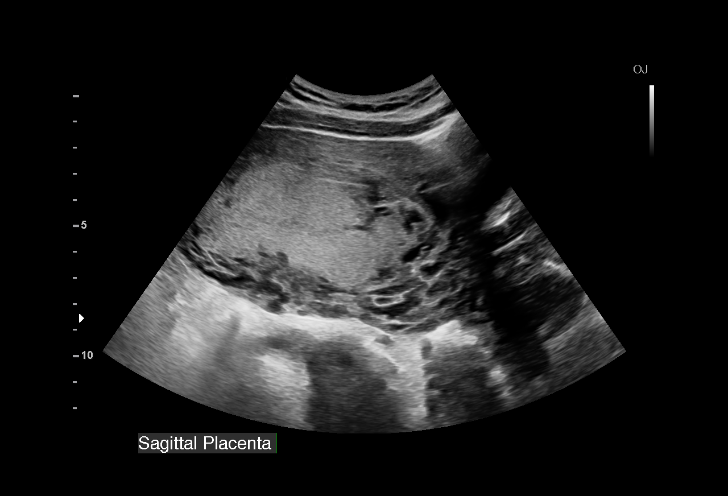
[im 3/22]
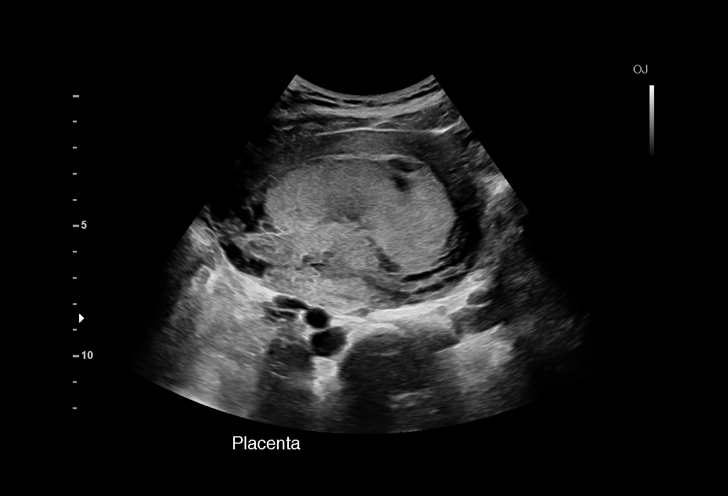
[im 4/22]
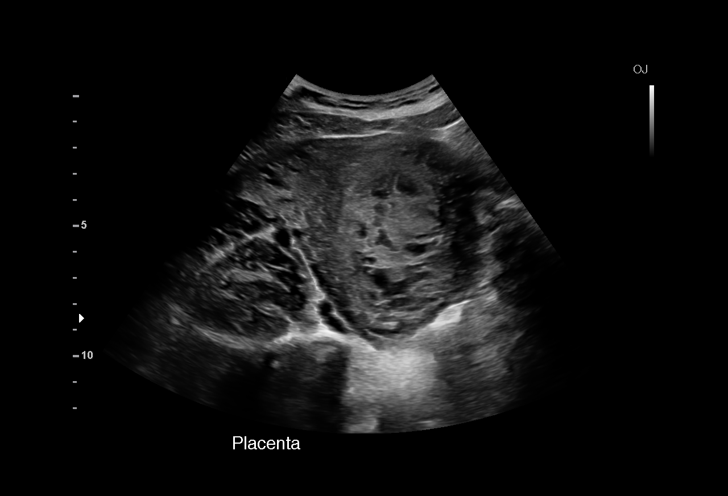
[im 5/22]
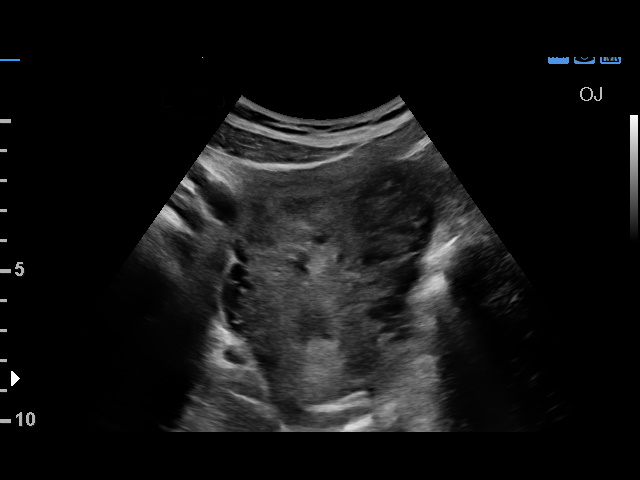
[im 7/22]
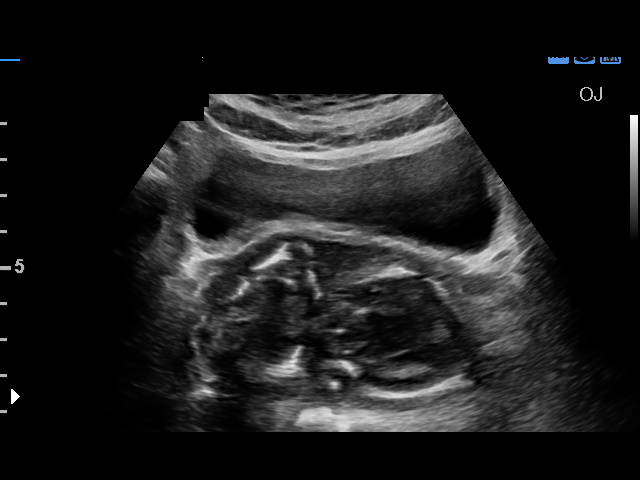
[im 8/22]
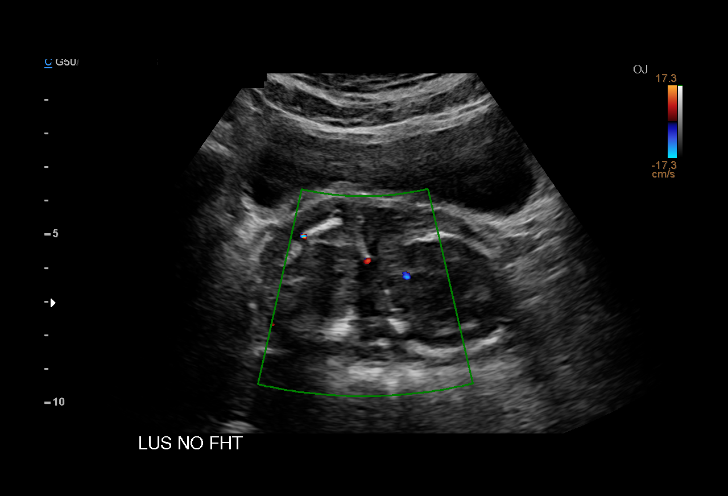
[im 9/22]
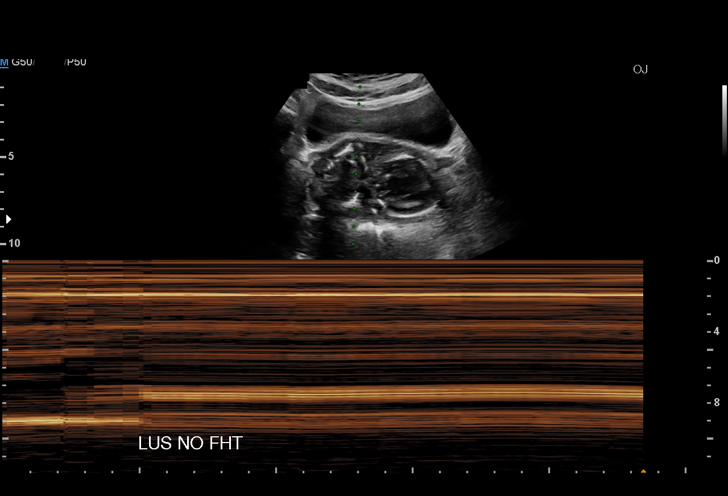
[im 11/22]
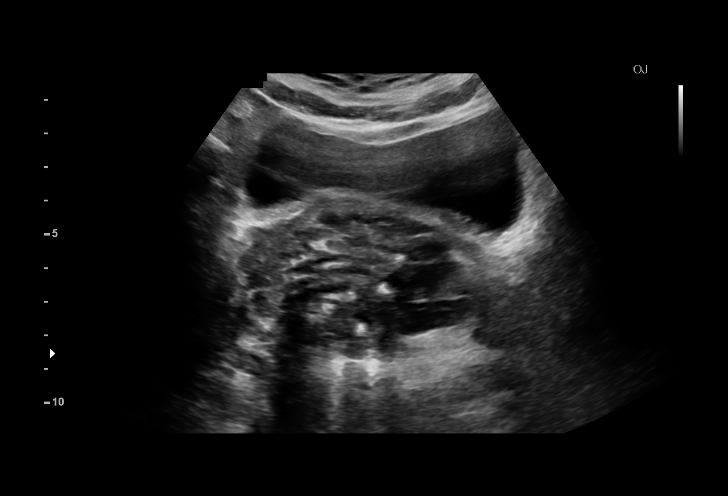
[im 12/22]
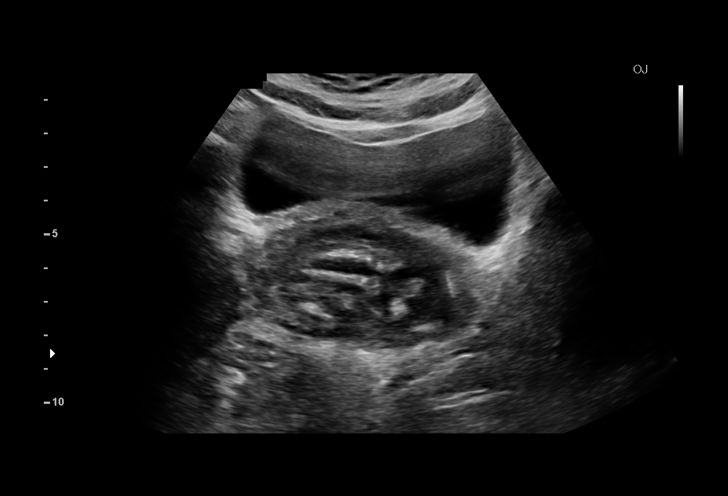
[im 14/22]
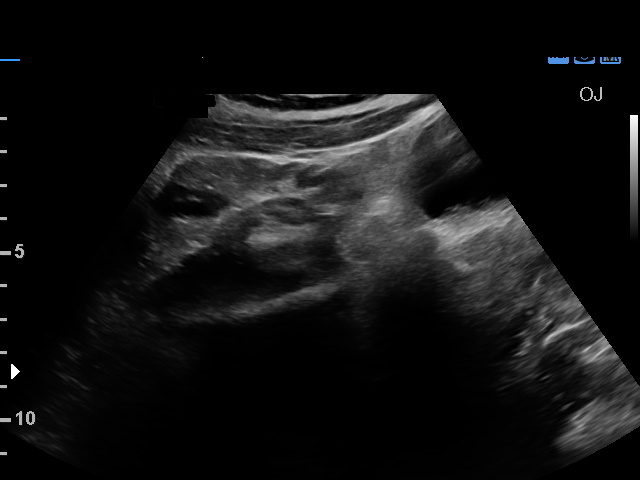
[im 15/22]
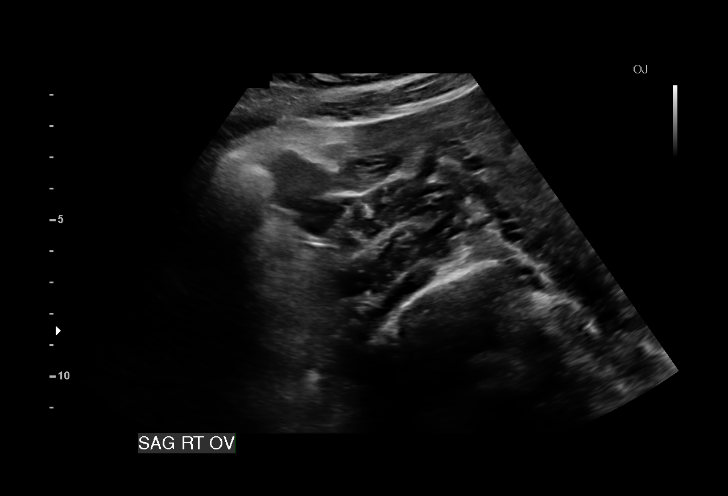
[im 16/22]
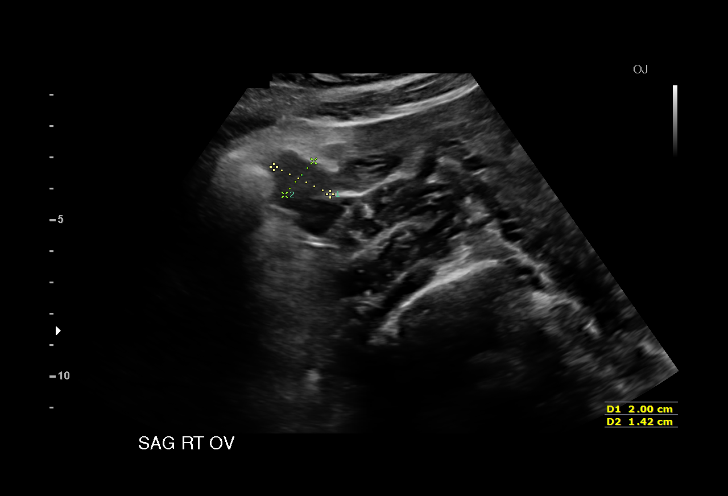
[im 18/22]
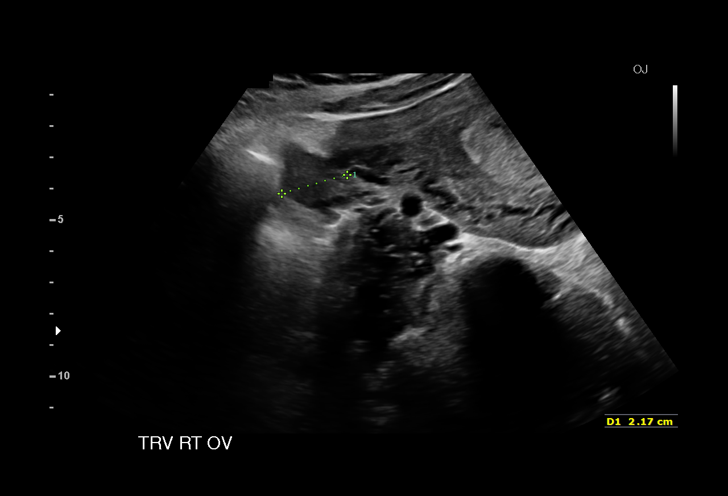
[im 19/22]
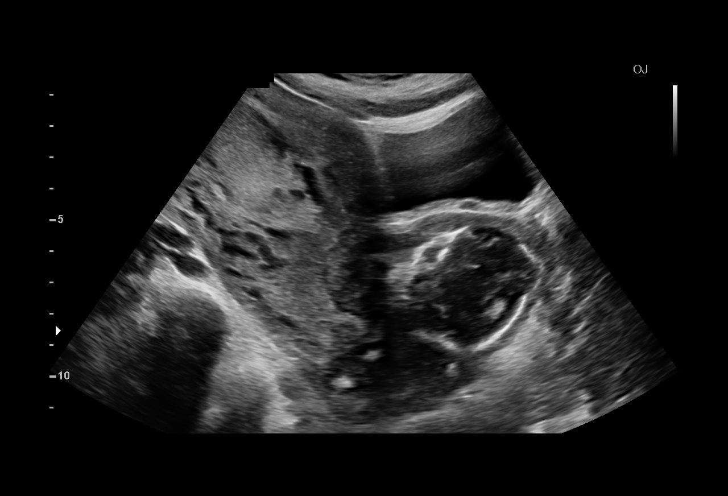
[im 20/22]
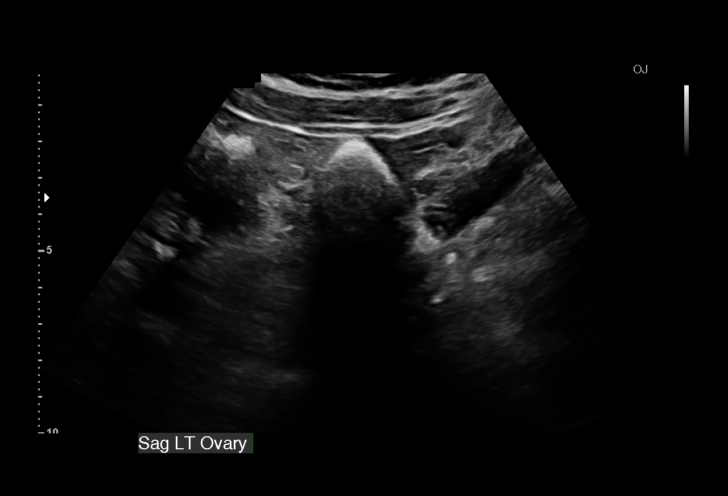
[im 22/22]
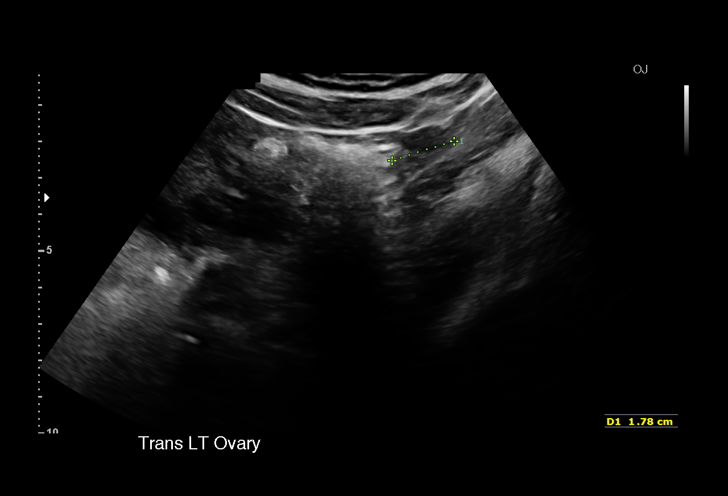

[16 of 22 positions shown; findings below may reference images not displayed]

OBSTETRICS REPORT

Service(s) Provided

US MFM OB LIMITED                                     76815.01
Indications

16 weeks gestation of pregnancy
Absent fetal heart tones                              O76
Premature rupture of membranes - leaking fluid
Vaginal bleeding in pregnancy, second trimester
Abdominal pain in pregnancy
Fetal Evaluation

Num Of Fetuses:    1
Cardiac Activity:  Absent
Presentation:      Cephalic
Placenta:          Right lateral, above
cervical os

Amniotic Fluid
AFI FV:      Anhydramnios
Gestational Age

LMP:           15w 1d        Date:  10/30/14                 EDD:   08/06/15
Best:          16w 6d     Det. By:  Early Ultrasound         EDD:   07/25/15
(01/04/15)
Impression

Intrauterine fetal demise at 16+6 weeks
Anhydramnios
Recommendations

Follow-up as clinically indicated

questions or concerns.

## 2017-08-14 IMAGING — US US OB COMP LESS 14 WK
1 series · 15 of 28 positions shown · non-contrast
Comparison: Pelvic ultrasound 03/23/2015.

CLINICAL DATA: Abdominal pain and pregnancy. Seven weeks 2 days
gestation.

EXAM:
OBSTETRIC <14 WK US AND TRANSVAGINAL OB US
TECHNIQUE: Both transabdominal and transvaginal ultrasound examinations were
performed for complete evaluation of the gestation as well as the
maternal uterus, adnexal regions, and pelvic cul-de-sac.
Transvaginal technique was performed to assess early pregnancy.

[Series 1: us ob comp less 14 wk · 34 acquisitions, 15 frames shown]
[im 1/34]
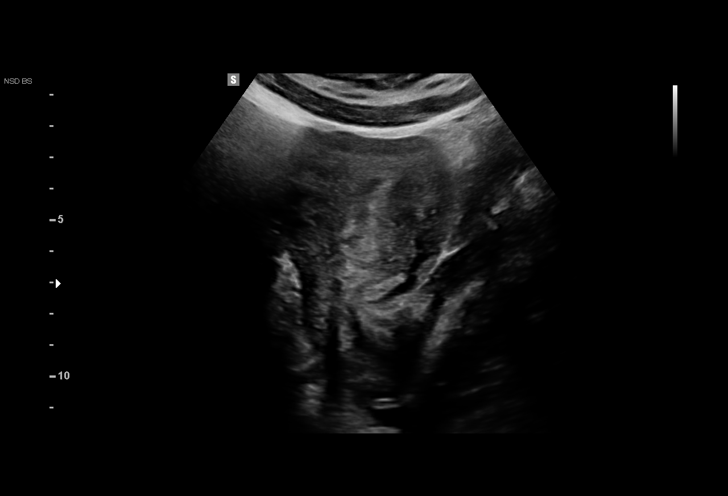
[im 3/34]
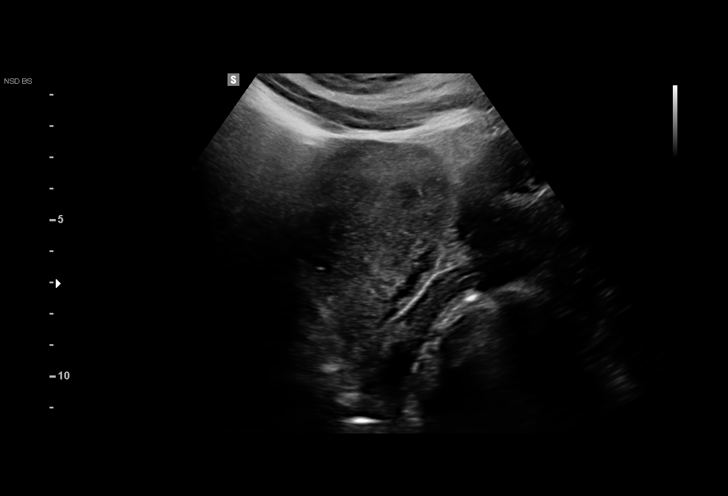
[im 5/34]
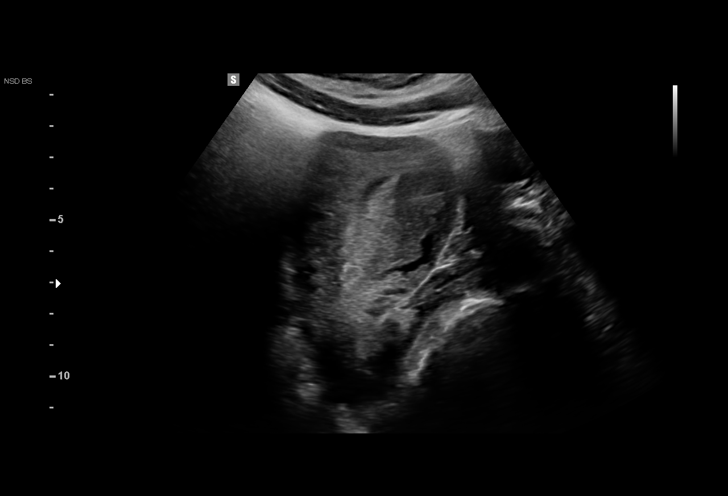
[im 8/34]
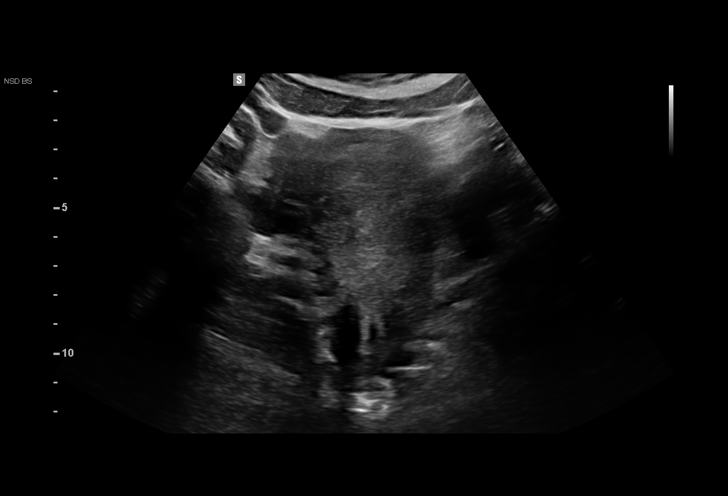
[im 10/34]
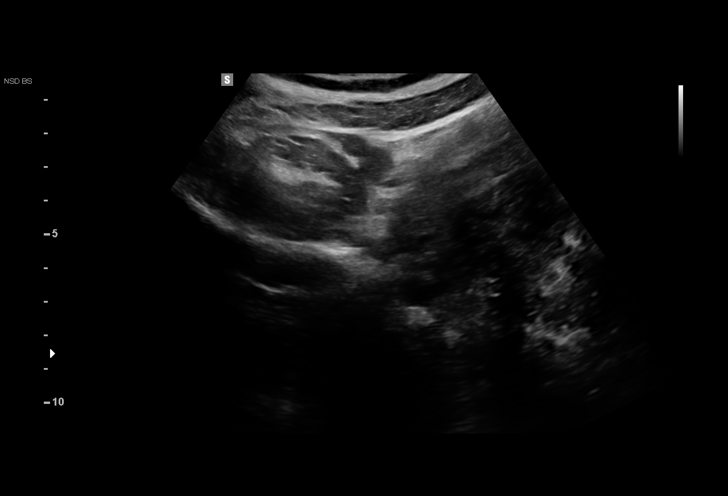
[im 13/34]
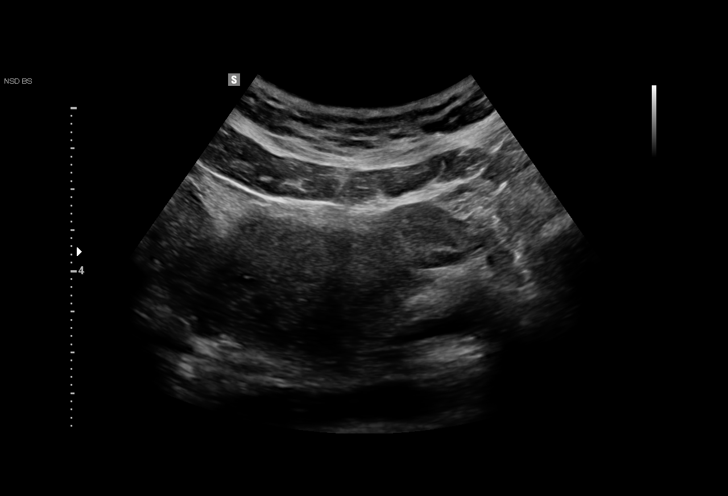
[im 15/34]
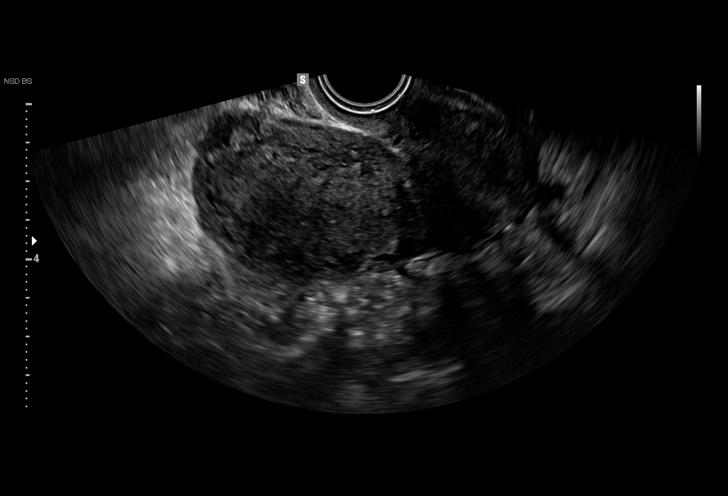
[im 18/34]
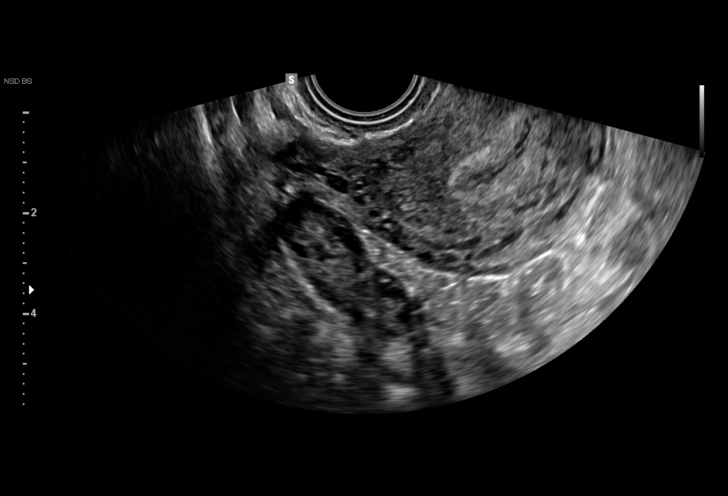
[im 19/34]
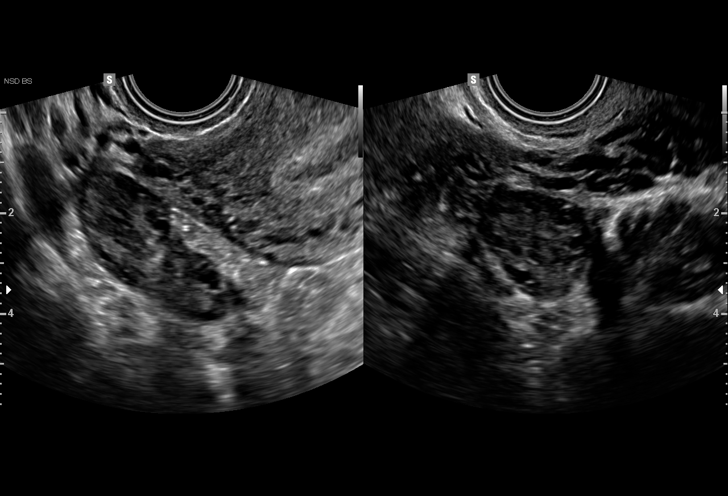
[im 21/34]
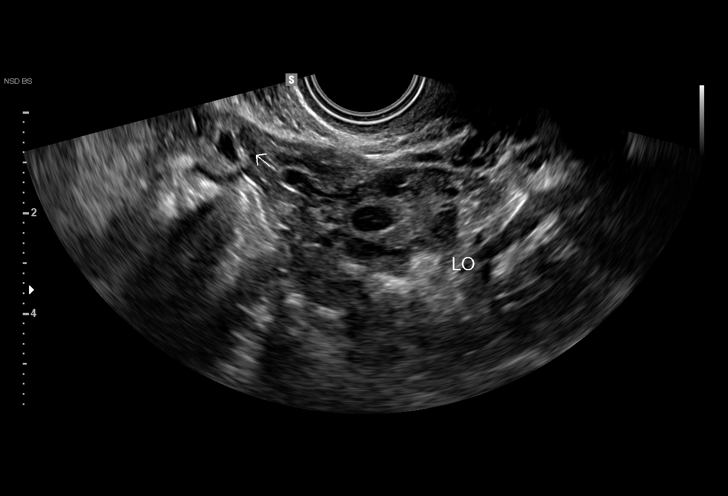
[im 24/34]
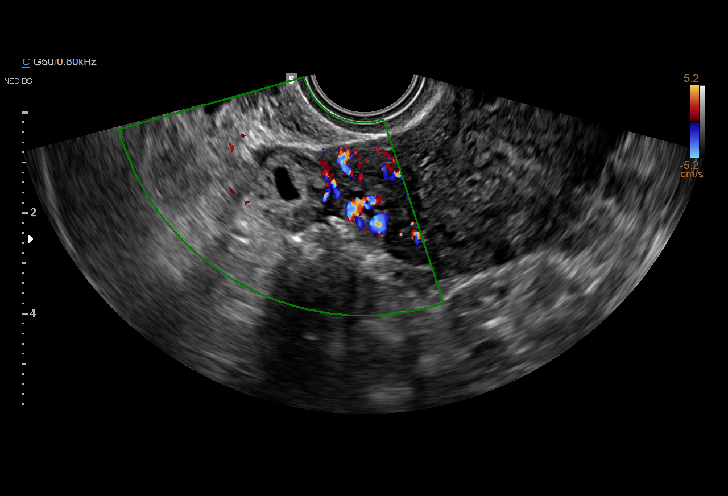
[im 26/34]
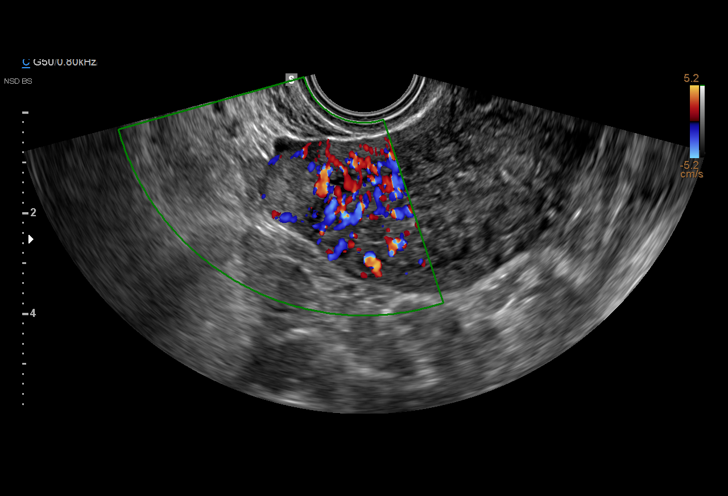
[im 29/34]
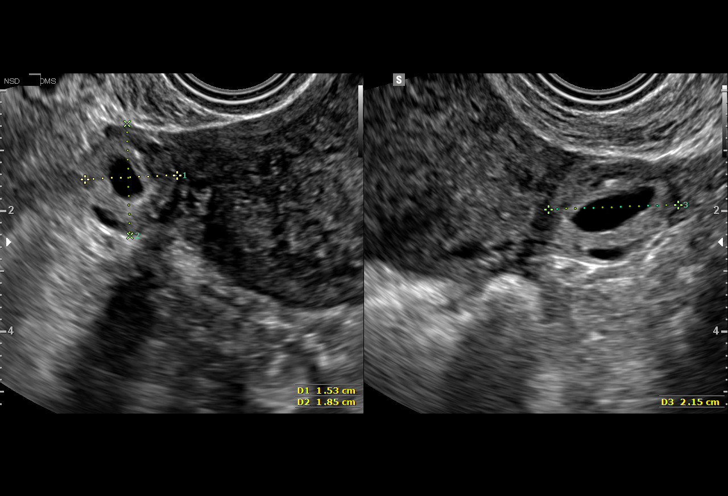
[im 31/34]
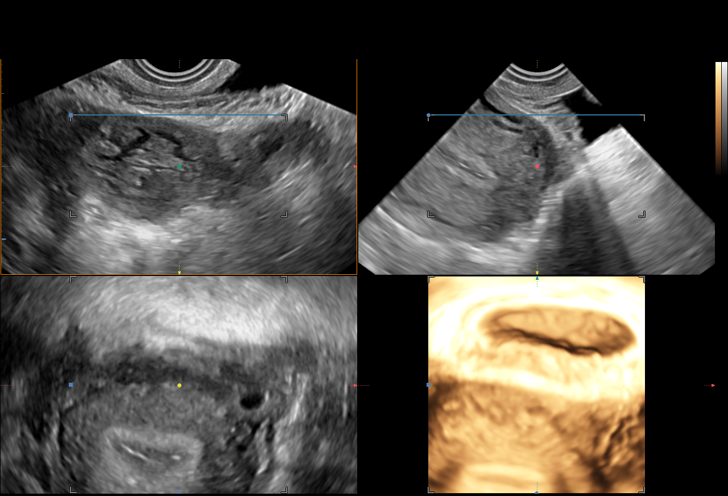
[im 34/34]
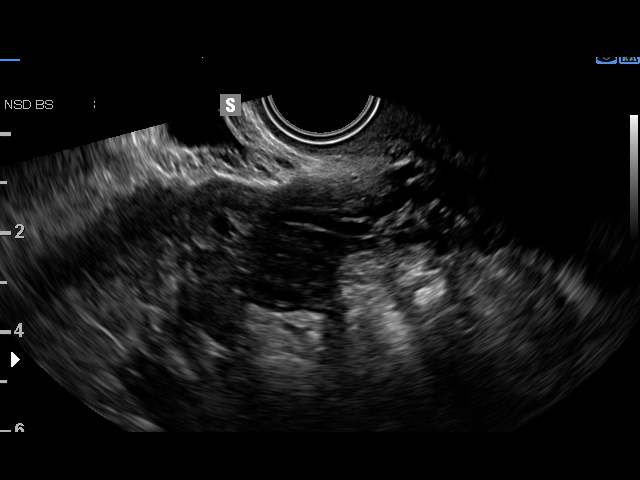

[15 of 28 positions shown; findings below may reference images not displayed]

FINDINGS: Intrauterine gestational sac: Absent

Yolk sac:  Absent

Embryo:  Absent

Maternal uterus/adnexae: A centrally anechoic "Mass" structure
contacting the left side of the uterus measures 1.5 x 1.9 x 2.2 cm.
This has a morphology which strongly favors gestational sac. The
presumed sac measures 0.7 x 0.5 x 1.4 cm.

No significant free fluid.
IMPRESSION: Findings which are highly suspicious for ectopic pregnancy
positioned along the left side of the uterus. This may be isthmic in
position. Critical test results telephoned to.Dr. Denisson.. at the
time of interpretation at . [DATE] p.m...on 11/21/2015....

## 2018-12-11 ENCOUNTER — Ambulatory Visit (HOSPITAL_COMMUNITY)
Admission: EM | Admit: 2018-12-11 | Discharge: 2018-12-11 | Disposition: A | Discharge status: Home / Self Care | Payer: Medicaid Other | Attending: Emergency Medicine | Admitting: Emergency Medicine

## 2018-12-11 ENCOUNTER — Other Ambulatory Visit: Payer: Self-pay

## 2018-12-11 ENCOUNTER — Encounter (HOSPITAL_COMMUNITY): Payer: Self-pay | Admitting: Emergency Medicine

## 2018-12-11 DIAGNOSIS — Z20828 Contact with and (suspected) exposure to other viral communicable diseases: Secondary | ICD-10-CM | POA: Diagnosis not present

## 2018-12-11 DIAGNOSIS — Z0189 Encounter for other specified special examinations: Secondary | ICD-10-CM | POA: Diagnosis present

## 2018-12-11 NOTE — ED Triage Notes (Signed)
PT spent the weekend with someone who is now sypmtomatic and is getting covid tested today. PT requests testing.

## 2018-12-11 NOTE — ED Provider Notes (Signed)
[place]    CSN: [id number] Arrival date & time: 12/11/18  7829      History   Chief Complaint Chief Complaint  Patient presents with  . Covid testing    HPI Maria Orr is a 23 y.o. female.   Patient presents with request for a COVID test.  She is asymptomatic but states she was around someone over the weekend whose mother may possibly be COVID positive.  Patient denies fever, chills, cough, shortness of breath, vomiting, diarrhea, or other symptoms.  The history is provided by the patient.    Past Medical History:  Diagnosis Date  . Chlamydia   . Hx MRSA infection   . 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 Ectopic pregnancy 11/21/2015    Class: Acute  . Spontaneous pregnancy loss 02/14/2015  . Abortion, inevitable 02/13/2015  . 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 Surgery R arm Right    Remove blood clots.    OB History    Gravida  2   Para  0   Term      Preterm      AB  2   Living  0     SAB      TAB      Ectopic  1   Multiple      Live Births               Home Medications    Prior to Admission medications   Medication Sig Start Date End Date Taking? Authorizing Provider  Calcium Carbonate-Vitamin D (CALCIUM-VITAMIN D3 PO) Take 1 tablet by mouth daily.   Yes [provider]  mupirocin ointment (BACTROBAN) 2 % Place 1 application into the nose 2 (two) times daily. 02/14/15   Florian Buff, MD  oxyCODONE-acetaminophen (PERCOCET/ROXICET) 5-325 MG tablet Take 1-2 tablets by mouth every 6 (six) hours as needed. 11/21/15   Leftwich-Kirby, Kathie Dike, CNM  Prenatal Vit-Fe Fumarate-FA (PRENATAL VITAMINS PLUS) 27-1 MG TABS Take 1 tablet by mouth daily. 01/04/15   Teague Carlis Abbott, Collene Leyden, PA-C  promethazine (PHENERGAN) 25 MG tablet Take 0.5-1 tablets (12.5-25 mg total) by mouth  every 6 (six) hours as needed. 11/21/15   Leftwich-Kirby, Kathie Dike, CNM    Family History Family History  Problem Relation Age of Onset  . Neuropathy Mother   . Cancer Brother     Social History Social History   Tobacco Use  . Smoking status: Never Smoker  . Smokeless tobacco: Never Used  Substance Use Topics  . Alcohol use: No  . Drug use: No     Allergies   Patient has no known allergies.   Review of Systems Review of Systems  Constitutional: Negative for chills and fever.  HENT: Negative for ear pain and sore throat.   Eyes: Negative for pain and visual disturbance.  Respiratory: Negative for cough and shortness of breath.   Cardiovascular: Negative for chest pain and palpitations.  Gastrointestinal: Negative for abdominal pain, diarrhea and vomiting.  Genitourinary: Negative for dysuria and hematuria.  Musculoskeletal: Negative for arthralgias and back pain.  Skin: Negative for color change and rash.  Neurological: Negative for seizures and syncope.  All other systems reviewed and are negative.    Physical Exam Triage Vital Signs ED Triage Vitals  Enc Vitals Group     BP  12/11/18 1004 121/69     Pulse Rate 12/11/18 1004 68     Resp 12/11/18 1004 16     Temp 12/11/18 1004 98.5 F (36.9 C)     Temp Source 12/11/18 1004 Oral     SpO2 12/11/18 1004 100 %     Weight --      Height --      Head Circumference --      Peak Flow --      Pain Score 12/11/18 1002 0     Pain Loc --      Pain Edu? --      Excl. in GC? --    No data found.  Updated Vital Signs BP 121/69   Pulse 68   Temp 98.5 F (36.9 C) (Oral)   Resp 16   LMP 11/27/2018   SpO2 100%   Visual Acuity Right Eye Distance:   Left Eye Distance:   Bilateral Distance:    Right Eye Near:   Left Eye Near:    Bilateral Near:     Physical Exam Vitals signs and nursing note reviewed.  Constitutional:      General: She is not in acute distress.    Appearance: She is well-developed.  HENT:      Head: Normocephalic and atraumatic.     Right Ear: Tympanic membrane normal.     Left Ear: Tympanic membrane normal.     Nose: Nose normal.     Mouth/Throat:     Mouth: Mucous membranes are moist.     Pharynx: Oropharynx is clear.  Eyes:     Conjunctiva/sclera: Conjunctivae normal.  Neck:     Musculoskeletal: Neck supple.  Cardiovascular:     Rate and Rhythm: Normal rate and regular rhythm.     Heart sounds: No murmur.  Pulmonary:     Effort: Pulmonary effort is normal. No respiratory distress.     Breath sounds: Normal breath sounds.  Abdominal:     General: Bowel sounds are normal.     Palpations: Abdomen is soft.     Tenderness: There is no abdominal tenderness. There is no guarding or rebound.  Skin:    General: Skin is warm and dry.     Findings: No rash.  Neurological:     Mental Status: She is alert.      UC Treatments / Results  Labs (all labs ordered are listed, but only abnormal results are displayed) Labs Reviewed  NOVEL CORONAVIRUS, NAA (HOSP ORDER, SEND-OUT TO REF LAB; TAT 18-24 HRS)    EKG   Radiology No results found.  Procedures Procedures (including critical care time)  Medications Ordered in UC Medications - No data to display  Initial Impression / Assessment and Plan / UC Course  I have reviewed the triage vital signs and the nursing notes.  Pertinent labs & imaging results that were available during my care of the patient were reviewed by me and considered in my medical decision making (see chart for details).   Patient request for COVID test.  COVID test performed here.  Instructed patient to self quarantine until her test results are back.  Instructed patient to go to the emergency department if she develops high fever, shortness of breath, severe diarrhea, or other concerning symptoms.  Patient agrees with plan of care.    Final Clinical Impressions(s) / UC Diagnoses   Final diagnoses:  Patient request for diagnostic testing      Discharge Instructions     Your  COVID test is pending.  You should self quarantine until your test result is back and is negative.    Go to the emergency department if you develop shortness of breath, high fever, severe diarrhea, or other concerning symptoms.        ED Prescriptions    None     Controlled Substance Prescriptions [place] Controlled Substance Registry consulted? Not Applicable   Mickie Bail, NP 12/11/18 1028

## 2018-12-11 NOTE — Discharge Instructions (Addendum)
Your COVID test is pending.  You should self quarantine until your test result is back and is negative.   ° °Go to the emergency department if you develop shortness of breath, high fever, severe diarrhea, or other concerning symptoms.   ° ° ° °

## 2018-12-13 LAB — NOVEL CORONAVIRUS, NAA (HOSP ORDER, SEND-OUT TO REF LAB; TAT 18-24 HRS): SARS-CoV-2, NAA: NOT DETECTED

## 2018-12-17 ENCOUNTER — Encounter (HOSPITAL_COMMUNITY): Payer: Self-pay

## 2019-04-01 ENCOUNTER — Inpatient Hospital Stay (HOSPITAL_COMMUNITY)
Admission: AD | Admit: 2019-04-01 | Discharge: 2019-04-01 | Disposition: A | Discharge status: Home / Self Care | Payer: Self-pay | Attending: Obstetrics and Gynecology | Admitting: Obstetrics and Gynecology

## 2019-04-01 ENCOUNTER — Other Ambulatory Visit: Payer: Self-pay

## 2019-04-01 ENCOUNTER — Inpatient Hospital Stay (HOSPITAL_COMMUNITY): Payer: Self-pay

## 2019-04-01 ENCOUNTER — Encounter (HOSPITAL_COMMUNITY): Payer: Self-pay | Admitting: Obstetrics and Gynecology

## 2019-04-01 DIAGNOSIS — Z792 Long term (current) use of antibiotics: Secondary | ICD-10-CM | POA: Insufficient documentation

## 2019-04-01 DIAGNOSIS — O98311 Other infections with a predominantly sexual mode of transmission complicating pregnancy, first trimester: Secondary | ICD-10-CM | POA: Insufficient documentation

## 2019-04-01 DIAGNOSIS — O98211 Gonorrhea complicating pregnancy, first trimester: Secondary | ICD-10-CM | POA: Insufficient documentation

## 2019-04-01 DIAGNOSIS — Z3A01 Less than 8 weeks gestation of pregnancy: Secondary | ICD-10-CM | POA: Insufficient documentation

## 2019-04-01 DIAGNOSIS — N939 Abnormal uterine and vaginal bleeding, unspecified: Secondary | ICD-10-CM

## 2019-04-01 DIAGNOSIS — Z8614 Personal history of Methicillin resistant Staphylococcus aureus infection: Secondary | ICD-10-CM | POA: Insufficient documentation

## 2019-04-01 DIAGNOSIS — O26899 Other specified pregnancy related conditions, unspecified trimester: Secondary | ICD-10-CM

## 2019-04-01 DIAGNOSIS — O3680X0 Pregnancy with inconclusive fetal viability, not applicable or unspecified: Secondary | ICD-10-CM | POA: Insufficient documentation

## 2019-04-01 DIAGNOSIS — Z809 Family history of malignant neoplasm, unspecified: Secondary | ICD-10-CM | POA: Insufficient documentation

## 2019-04-01 DIAGNOSIS — A749 Chlamydial infection, unspecified: Secondary | ICD-10-CM | POA: Insufficient documentation

## 2019-04-01 DIAGNOSIS — N949 Unspecified condition associated with female genital organs and menstrual cycle: Secondary | ICD-10-CM

## 2019-04-01 LAB — CBC
HCT: 40.6 % (ref 36.0–46.0)
Hemoglobin: 13.3 g/dL (ref 12.0–15.0)
MCH: 29.2 pg (ref 26.0–34.0)
MCHC: 32.8 g/dL (ref 30.0–36.0)
MCV: 89 fL (ref 80.0–100.0)
Platelets: 401 10*3/uL — ABNORMAL HIGH (ref 150–400)
RBC: 4.56 MIL/uL (ref 3.87–5.11)
RDW: 14.1 % (ref 11.5–15.5)
WBC: 13.8 10*3/uL — ABNORMAL HIGH (ref 4.0–10.5)
nRBC: 0 % (ref 0.0–0.2)

## 2019-04-01 LAB — URINALYSIS, ROUTINE W REFLEX MICROSCOPIC
Bilirubin Urine: NEGATIVE
Glucose, UA: NEGATIVE mg/dL
Ketones, ur: 5 mg/dL — AB
Leukocytes,Ua: NEGATIVE
Nitrite: NEGATIVE
Protein, ur: 30 mg/dL — AB
Specific Gravity, Urine: 1.025 (ref 1.005–1.030)
pH: 6 (ref 5.0–8.0)

## 2019-04-01 LAB — HCG, QUANTITATIVE, PREGNANCY: hCG, Beta Chain, Quant, S: 878 m[IU]/mL — ABNORMAL HIGH (ref <5)

## 2019-04-01 LAB — WET PREP, GENITAL
Sperm: NONE SEEN
Trich, Wet Prep: NONE SEEN
Yeast Wet Prep HPF POC: NONE SEEN

## 2019-04-01 MED ORDER — METRONIDAZOLE 500 MG PO TABS: 500.0000 mg | ORAL_TABLET | Freq: Two times a day (BID) | ORAL | 0 refills | Status: DC

## 2019-04-01 MED ORDER — AZITHROMYCIN 250 MG PO TABS
1000.0000 mg | ORAL_TABLET | Freq: Once | ORAL | Status: AC
  Administered 2019-04-01: 1000 mg via ORAL
  Filled 2019-04-01: qty 4

## 2019-04-01 MED ORDER — CEFTRIAXONE SODIUM 250 MG IJ SOLR
250.0000 mg | Freq: Once | INTRAMUSCULAR | Status: AC
  Administered 2019-04-01: 250 mg via INTRAMUSCULAR
  Filled 2019-04-01: qty 250

## 2019-04-01 NOTE — MAU Note (Signed)
+  HPT on Christmas.  Been experiencing severe pelvic pain, started on Sat.  Had light spotting on Sat, none since.

## 2019-04-01 NOTE — MAU Provider Note (Signed)
History     CSN: [id number]  Arrival date and time: 04/01/19 1843   First Provider Initiated Contact with Patient 04/01/19 2121      Chief Complaint  Patient presents with  . Pelvic Pain  . Possible Pregnancy   Maria Orr is a 23 y.o. G3P0 at [redacted]w[redacted]d by LMP who presents to MAU with complaints of abdominal pain and vaginal bleeding. She reports finding out she was pregnant on Christmas then started having abdominal pain and vaginal bleeding on 12/26. She describes the abdominal pain as lower intermittent sharp cramping- rates pain 6/10- has not taken any medication for abdominal pain. She reports vaginal spotting that occurred one day on 12/26 - describes as bright red vaginal spotting that she did not have to wear a panty liner for. She reports vaginal spotting resolved that same day. She reports hx of Gonorrhea in the 2017 with different partner.    OB History    Gravida  3   Para  0   Term      Preterm      AB  2   Living  0     SAB      TAB      Ectopic  1   Multiple      Live Births              Past Medical History:  Diagnosis Date  . Chlamydia   . Hx MRSA infection   . 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 Surgery R arm Right    Remove blood clots.    Family History  Problem Relation Age of Onset  . Neuropathy Mother   . Cancer Brother     Social History   Tobacco Use  . Smoking status: Never Smoker  . Smokeless tobacco: Never Used  Substance Use Topics  . Alcohol use: No  . Drug use: No    Allergies: No Known Allergies  Medications Prior to Admission  Medication Sig Dispense Refill Last Dose  . Calcium Carbonate-Vitamin D (CALCIUM-VITAMIN D3 PO) Take 1 tablet by mouth daily.   03/31/2019 at Unknown time  . mupirocin ointment (BACTROBAN) 2 % Place 1 application into the nose 2 (two) times daily. 22 g 0   . oxyCODONE-acetaminophen (PERCOCET/ROXICET) 5-325 MG  tablet Take 1-2 tablets by mouth every 6 (six) hours as needed. 6 tablet 0   . Prenatal Vit-Fe Fumarate-FA (PRENATAL VITAMINS PLUS) 27-1 MG TABS Take 1 tablet by mouth daily. 30 tablet 11   . promethazine (PHENERGAN) 25 MG tablet Take 0.5-1 tablets (12.5-25 mg total) by mouth every 6 (six) hours as needed. 30 tablet 0     Review of Systems  Constitutional: Negative.   Respiratory: Negative.   Cardiovascular: Negative.   Gastrointestinal: Positive for abdominal pain. Negative for constipation, diarrhea, nausea and vomiting.  Genitourinary: Positive for vaginal bleeding. Negative for difficulty urinating, dysuria, frequency, pelvic pain and urgency.       None currently  Musculoskeletal: Negative.   Neurological: Negative.    Physical Exam   Blood pressure 132/76, pulse 87, temperature 98.2 F (36.8 C), resp. rate 16, height 5\' 2"  (1.575 m), weight 68.8 kg, last menstrual period 02/18/2019, SpO2 99 %, unknown if currently breastfeeding.  Physical Exam  Nursing note and vitals reviewed. Constitutional: She is oriented to person, place, and time. She appears well-developed and well-nourished. No distress.  Cardiovascular: Normal rate  and regular rhythm.  Respiratory: Effort normal and breath sounds normal. No respiratory distress. She has no wheezes.  GI: Soft. She exhibits no distension. There is no abdominal tenderness. There is no rebound and no guarding.  Genitourinary: Cervix exhibits motion tenderness.    Genitourinary Comments: Pelvic exam: blind swabs collection, vaginal odor present upon examination  Bimanual exam: Cervix 0/long/high, firm, anterior, POS CMT, uterus nontender, nonenlarged, adnexa without tenderness, enlargement, or mass   Neurological: She is alert and oriented to person, place, and time.  Psychiatric: She has a normal mood and affect. Her behavior is normal. Thought content normal.   MAU Course  Procedures  MDM Orders Placed This Encounter  Procedures  .  Wet prep, genital  . US OB LESS THAN 14 WEEKS WITH OB TRANSVAGINAL  . Urinalysis, Routine w reflex microscopic  . CBC  . hCG, quantitative, pregnancy   Meds ordered this encounter  Medications  . AND Linked Order Group   . cefTRIAXone (ROCEPHIN) injection 250 mg     Order Specific Question:   Antibiotic Indication:     Answer:   STD   . azithromycin (ZITHROMAX) tablet 1,000 mg  . metroNIDAZOLE (FLAGYL) 500 MG tablet    Sig: Take 1 tablet (500 mg total) by mouth 2 (two) times daily.    Dispense:  14 tablet    Refill:  0    Order Specific Question:   Supervising Provider    Answer:   CONSTANT, [place]   Patient exhibits CMT on examination - patient reports hx of gonorrhea in 2017, denies currently being with same partner. Educated and discussed with patient that prophylactic treatment can be given in MAU today prior to discharge home.  Rocephin and Azithromycin ordered and given   Discussed results of [place] and lab work with patient- discussed need for follow up labs on 12/31- unable to schedule in office d/t schedule being full - will have patient return to MAU for follow up HCG.   Return to MAU as needed for reasons discussed. GC/C pending.  Pt stable at time of discharge.    Assessment and Plan   1. Pregnancy of unknown anatomic location   2. Vaginal spotting   3. Pelvic pain during pregnancy   4. CMT (cervical motion tenderness)    Discharge home Return to MAU on 12/31 for repeat HCG  Return to MAU as needed for reasons discussed and/or emergencies  Ectopic precautions discussed  Rx for Flagyl sent to pharmacy of choice  GC/C pending - treatment in MAU prior to discharge home   Follow-up [facility]. Go on 04/04/2019.   Specialty: Obstetrics and Gynecology Why: Return to MAU on  Contact information: [address] [id number] [place] [phone]         Allergies as of 04/01/2019   No  Known Allergies     Medication List    STOP taking these medications   mupirocin ointment 2 % Commonly known as: BACTROBAN   oxyCODONE-acetaminophen 5-325 MG tablet Commonly known as: PERCOCET/ROXICET     TAKE these medications   CALCIUM-VITAMIN D3 PO Take 1 tablet by mouth daily.   metroNIDAZOLE 500 MG tablet Commonly known as: FLAGYL Take 1 tablet (500 mg total) by mouth 2 (two) times daily.   PreNatal Vitamins Plus 27-1 MG Tabs Take 1 tablet by mouth daily.   promethazine 25 MG tablet Commonly known as: PHENERGAN Take 0.5-1 tablets (12.5-25 mg total)  by mouth every 6 (six) hours as needed.       Sharyon CableVeronica C Aamina Skiff CNM 04/01/2019, 11:29 PM

## 2019-04-02 LAB — POCT PREGNANCY, URINE: Preg Test, Ur: POSITIVE — AB

## 2019-04-02 LAB — GC/CHLAMYDIA PROBE AMP (~~LOC~~) NOT AT ARMC
Chlamydia: POSITIVE — AB
Comment: NEGATIVE
Comment: NORMAL
Neisseria Gonorrhea: POSITIVE — AB

## 2019-04-04 ENCOUNTER — Other Ambulatory Visit: Payer: Self-pay

## 2019-04-04 ENCOUNTER — Inpatient Hospital Stay (HOSPITAL_COMMUNITY)
Admission: AD | Admit: 2019-04-04 | Discharge: 2019-04-04 | Disposition: A | Discharge status: Home / Self Care | Payer: Medicaid Other | Attending: Obstetrics & Gynecology | Admitting: Obstetrics & Gynecology

## 2019-04-04 DIAGNOSIS — O3680X0 Pregnancy with inconclusive fetal viability, not applicable or unspecified: Secondary | ICD-10-CM

## 2019-04-04 DIAGNOSIS — O26891 Other specified pregnancy related conditions, first trimester: Secondary | ICD-10-CM | POA: Insufficient documentation

## 2019-04-04 DIAGNOSIS — Z3A01 Less than 8 weeks gestation of pregnancy: Secondary | ICD-10-CM | POA: Insufficient documentation

## 2019-04-04 LAB — HCG, QUANTITATIVE, PREGNANCY: hCG, Beta Chain, Quant, S: 2072 m[IU]/mL — ABNORMAL HIGH (ref <5)

## 2019-04-04 MED ORDER — AZITHROMYCIN 500 MG PO TABS: 1000.0000 mg | ORAL_TABLET | Freq: Once | ORAL | 0 refills | Status: AC

## 2019-04-04 NOTE — MAU Note (Signed)
Blood drawn.  Pt asked phleb about the [place], again explained need to speak to provider.  Radiologist recommend rept in 14 days.

## 2019-04-04 NOTE — MAU Provider Note (Addendum)
S: 23 y.o. G3P0020 @[redacted]w[redacted]d  by LMP presents to MAU for repeat hcg.  She denies abdominal pain or vaginal bleeding today.    Her quant hcg on 04/01/19 was 878 and ultrasound showed no IUP, no ectopic.  She was treated in MAU for chlamydia and gonorrhea and testing confirmed positive results.  HPI  O: BP 128/74 (BP Location: Left Arm)   Pulse 86   Temp 98.5 F (36.9 C) (Oral)   Resp 16   LMP 02/18/2019   SpO2 100%   VS reviewed, nursing note reviewed,  Constitutional: well developed, well nourished, no distress HEENT: normocephalic CV: normal rate Pulm/chest wall: normal effort Abdomen: soft Neuro: alert and oriented x 3 Skin: warm, dry Psych: affect normal  Results for orders placed or performed during the hospital encounter of 04/04/19 (from the past 24 hour(s))  hCG, quantitative, pregnancy     Status: Abnormal   Collection Time: 04/04/19  7:59 AM  Result Value Ref Range   hCG, Beta Chain, Quant, S 2,072 (H) <5 mIU/mL       MDM: Ordered labs/reviewed results.  Quant hcg rose appropriately.  Discussed results with pt.  Pt to follow up with ultrasound in 1 week at [facility].  Ectopic precautions given and pt to return to MAU sooner if s/sx of ectopic, as ruptured ectopic can be life threatening.  Pt stable at time of discharge.  Expedited partner therapy for one female partner, Silvestre Moment, was written for Suprax and azithromycin.  Pt reported she saw one of the azithromycin pills when she threw up in MAU on 12/28.  New Rx sent for pt to take today.  A: 1. Pregnancy of unknown anatomic location     P: D/C home with ectopic/bleeding precautions F/U with outpatient ultrasound as ordered Return to MAU as needed for emergencies  LEFTWICH-KIRBY, Maria Orr, CNM 2:18 PM

## 2019-04-04 NOTE — MAU Note (Signed)
Pt did get treated (shot and pills) in MAU on 12/28 when she was here.  Has not picked up rx for Flagyl yet.

## 2019-04-04 NOTE — MAU Note (Signed)
Pt states is doing ok, denies pain or bleeding. Asking about plan today, thought she would be getting another [place].  Explained she would need to talk to provider regarding that.

## 2019-04-16 ENCOUNTER — Ambulatory Visit (HOSPITAL_COMMUNITY): Admission: RE | Admit: 2019-04-16 | Payer: Medicaid Other | Source: Ambulatory Visit

## 2019-04-16 ENCOUNTER — Encounter: Payer: Self-pay | Admitting: Student

## 2019-04-16 ENCOUNTER — Encounter (HOSPITAL_COMMUNITY): Payer: Self-pay

## 2019-04-16 ENCOUNTER — Ambulatory Visit: Payer: Medicaid Other

## 2019-04-18 ENCOUNTER — Ambulatory Visit: Payer: Medicaid Other

## 2019-05-01 ENCOUNTER — Encounter: Payer: Medicaid Other | Admitting: Certified Nurse Midwife

## 2020-04-04 NOTE — L&D Delivery Note (Addendum)
[facility]N [facility] Delivery Note  Maria Orr is a 25 y.o. [id number] s/p VD at [redacted]w[redacted]d. She was admitted for IOL for FGR.   ROM: 8h 22m with Clear fluid GBS Status: Negative/-- (10/28 1141) Maximum Maternal Temperature: 98.9  Labor Progress: Patient presented to L&D for IOL for FGR. Initial SVE: 3/90/-3. She then progressed to complete.   Delivery Date/Time: 02/11/21 @1306  Delivery: Called to room and patient was complete and pushing. Head delivered LOA. No nuchal cord present. Anterior compound shoulder delivered followed by posterior shoulder and then body delivered in usual fashion. Infant with spontaneous cry, placed on mother's abdomen, dried and stimulated. Cord clamped x 2 after 1-minute delay, and cut by PA-student. Cord blood drawn. Placenta delivered spontaneously with gentle cord traction. Fundus firm with massage and Pitocin. Labia, perineum, vagina, and cervix inspected with bilateral labial tear and 1st degree perineal repaired. Placenta was examined after and found to be intact with normal 3-vessel cord.   Placenta: Intact 3VC Complications: None Lacerations: bilateral labial tear, R labial tear repaired with 3-0 Vicryl rapide; 1st degree perineal not repaired EBL: 33mL Analgesia: Epidural  Postpartum Planning [X]  message to sent to schedule follow-up  [X]  vaccines UTD  Infant: APGARs 8/9  pending weight  , DO 02/11/2021, 2:21 PM PGY-1, [facility]  Midwife attestation: I was gloved and present for delivery in its entirety and I agree with the above resident's note.  10/9, CNM 2:50 PM

## 2020-07-30 ENCOUNTER — Ambulatory Visit (INDEPENDENT_AMBULATORY_CARE_PROVIDER_SITE_OTHER): Payer: Medicaid Other

## 2020-07-30 DIAGNOSIS — O0990 Supervision of high risk pregnancy, unspecified, unspecified trimester: Secondary | ICD-10-CM

## 2020-07-30 MED ORDER — PREPLUS 27-1 MG PO TABS: 1.0000 | ORAL_TABLET | Freq: Every day | ORAL | 13 refills | Status: AC

## 2020-07-30 MED ORDER — GOJJI WEIGHT SCALE MISC: 0 refills | Status: DC

## 2020-07-30 MED ORDER — BLOOD PRESSURE KIT DEVI: 0 refills | Status: AC

## 2020-07-30 NOTE — Progress Notes (Signed)
Patient was assessed and managed by nursing staff during this encounter. I have reviewed the chart and agree with the documentation and plan. I have also made any necessary editorial changes.  Catalina Antigua, MD 07/30/2020 3:42 PM

## 2020-07-30 NOTE — Progress Notes (Signed)
  Virtual Visit via Telephone Note  I connected with Maria Orr on 07/30/20 at  9:00 AM EDT by telephone and verified that I am speaking with the correct person using two identifiers.  Location: Patient: Home Provider: Haskel Khan    I discussed the limitations, risks, security and privacy concerns of performing an evaluation and management service by telephone and the availability of in person appointments. I also discussed with the patient that there may be a patient responsible charge related to this service. The patient expressed understanding and agreed to proceed.   History of Present Illness: PRENATAL INTAKE SUMMARY  Ms. Hiers presents today New OB Nurse Interview.  OB History    Gravida  4   Para  0   Term      Preterm      AB  3   Living  0     SAB  1   IAB      Ectopic  1   Multiple      Live Births             I have reviewed the patient's medical, obstetrical, social, and family histories, medications, and available lab results.  SUBJECTIVE She has no unusual complaints   Observations/Objective: Initial nurse interview for history/labs (New OB)  EDD: 03/01/2021 1st trimester u/s completed at [facility] [place]: [redacted]w[redacted]d G4P0 FHT: non face to face interview   GENERAL APPEARANCE: non face to face interview   Assessment and Plan: High Risk Pregnancy - hx of ectopic and SAB Prenatal care: [facility] Labs/exam to be completed at next visit PNV's sent to the pharmacy BP cuff and scale sent to [facility] Download Babyscripts PHQ9= 0 GAD 7=0  Follow Up Instructions:   I discussed the assessment and treatment plan with the patient. The patient was provided an opportunity to ask questions and all were answered. The patient agreed with the plan and demonstrated an understanding of the instructions.   The patient was advised to call back or seek an in-person evaluation if the symptoms worsen or if the condition fails to improve as anticipated.  I  provided 20 minutes of non-face-to-face time during this encounter.   Dalphine Handing, CMA

## 2020-08-06 ENCOUNTER — Encounter: Payer: Self-pay | Admitting: Obstetrics & Gynecology

## 2020-08-06 ENCOUNTER — Ambulatory Visit (INDEPENDENT_AMBULATORY_CARE_PROVIDER_SITE_OTHER): Payer: Medicaid Other | Admitting: Obstetrics & Gynecology

## 2020-08-06 ENCOUNTER — Ambulatory Visit: Payer: Self-pay

## 2020-08-06 ENCOUNTER — Other Ambulatory Visit: Payer: Self-pay

## 2020-08-06 VITALS — BP 129/75 | HR 92 | Wt 132.0 lb

## 2020-08-06 DIAGNOSIS — Z3687 Encounter for antenatal screening for uncertain dates: Secondary | ICD-10-CM

## 2020-08-06 DIAGNOSIS — Z8759 Personal history of other complications of pregnancy, childbirth and the puerperium: Secondary | ICD-10-CM

## 2020-08-06 DIAGNOSIS — O0990 Supervision of high risk pregnancy, unspecified, unspecified trimester: Secondary | ICD-10-CM

## 2020-08-06 LAB — HEPATITIS C ANTIBODY: HCV Ab: NEGATIVE

## 2020-08-06 NOTE — Progress Notes (Signed)
NOB [redacted]w[redacted]d  NOB Intake done on 07/30/20. B/P cuff was sent Babyscripts was sent to pt email.  PHQ9 done on 07/30/20=0.  Last pap: 03/19/2019 +HPV per care everywhere.WNL done at [facility] ([place]) pt state she would like pap today.  Genetic Screening: Desires and wants to know gender.    CC:pt notes feeling as if she is going to pass out. Feeling dizzy and light headed consistently. Pt states B/P last night was 180/100. States she has Headaches all the time.  Pt will drink warm tea/increase water with HA's.  Pt notes having vision changes as well when she feels faint. 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

## 2020-08-06 NOTE — Patient Instructions (Signed)
Obstetrics: Normal and Problem Pregnancies (7th ed., pp. 102-121). [place], [place]: Elsevier."> Textbook of Family Medicine (9th ed., pp. 365-410). [place], [place]: [employer].">  First Trimester of Pregnancy  The first trimester of pregnancy starts on the first day of your last menstrual period until the end of week 12. This is months 1 through 3 of pregnancy. A week after a sperm fertilizes an egg, the egg will implant into the wall of the uterus and begin to develop into a baby. By the end of 12 weeks, all the baby's organs will be formed and the baby will be 2-3 inches in size. Body changes during your first trimester Your body goes through many changes during pregnancy. The changes vary and generally return to normal after your baby is born. Physical changes  You may gain or lose weight.  Your breasts may begin to grow larger and become tender. The tissue that surrounds your nipples (areola) may become darker.  Dark spots or blotches (chloasma or mask of pregnancy) may develop on your face.  You may have changes in your hair. These can include thickening or thinning of your hair or changes in texture. Health changes  You may feel nauseous, and you may vomit.  You may have heartburn.  You may develop headaches.  You may develop constipation.  Your gums may bleed and may be sensitive to brushing and flossing. Other changes  You may tire easily.  You may urinate more often.  Your menstrual periods will stop.  You may have a loss of appetite.  You may develop cravings for certain kinds of food.  You may have changes in your emotions from day to day.  You may have more vivid and strange dreams. Follow these instructions at home: Medicines  Follow your health care provider's instructions regarding medicine use. Specific medicines may be either safe or unsafe to take during pregnancy. Do not take any medicines unless told to by your health care provider.  Take a  prenatal vitamin that contains at least 600 micrograms (mcg) of folic acid. Eating and drinking  Eat a healthy diet that includes fresh fruits and vegetables, whole grains, good sources of protein such as meat, eggs, or tofu, and low-fat dairy products.  Avoid raw meat and unpasteurized juice, milk, and cheese. These carry germs that can harm you and your baby.  If you feel nauseous or you vomit: ? Eat 4 or 5 small meals a day instead of 3 large meals. ? Try eating a few soda crackers. ? Drink liquids between meals instead of during meals.  You may need to take these actions to prevent or treat constipation: ? Drink enough fluid to keep your urine pale yellow. ? Eat foods that are high in fiber, such as beans, whole grains, and fresh fruits and vegetables. ? Limit foods that are high in fat and processed sugars, such as fried or sweet foods. Activity  Exercise only as directed by your health care provider. Most people can continue their usual exercise routine during pregnancy. Try to exercise for 30 minutes at least 5 days a week.  Stop exercising if you develop pain or cramping in the lower abdomen or lower back.  Avoid exercising if it is very hot or humid or if you are at high altitude.  Avoid heavy lifting.  If you choose to, you may have sex unless your health care provider tells you not to. Relieving pain and discomfort  Wear a good support bra to relieve breast   tenderness.  Rest with your legs elevated if you have leg cramps or low back pain.  If you develop bulging veins (varicose veins) in your legs: ? Wear support hose as told by your health care provider. ? Elevate your feet for 15 minutes, 3-4 times a day. ? Limit salt in your diet. Safety  Wear your seat belt at all times when driving or riding in a car.  Talk with your health care provider if someone is verbally or physically abusive to you.  Talk with your health care provider if you are feeling sad or have  thoughts of hurting yourself. Lifestyle  Do not use hot tubs, steam rooms, or saunas.  Do not douche. Do not use tampons or scented sanitary pads.  Do not use herbal remedies, alcohol, illegal drugs, or medicines that are not approved by your health care provider. Chemicals in these products can harm your baby.  Do not use any products that contain nicotine or tobacco, such as cigarettes, e-cigarettes, and chewing tobacco. If you need help quitting, ask your health care provider.  Avoid cat litter boxes and soil used by cats. These carry germs that can cause birth defects in the baby and possibly loss of the unborn baby (fetus) by miscarriage or stillbirth. General instructions  During routine prenatal visits in the first trimester, your health care provider will do a physical exam, perform necessary tests, and ask you how things are going. Keep all follow-up visits. This is important.  Ask for help if you have counseling or nutritional needs during pregnancy. Your health care provider can offer advice or refer you to specialists for help with various needs.  Schedule a dentist appointment. At home, brush your teeth with a soft toothbrush. Floss gently.  Write down your questions. Take them to your prenatal visits. Where to find more information  American Pregnancy Association: americanpregnancy.org  American College of Obstetricians and Gynecologists: acog.org/en/Womens%20Health/Pregnancy  Office on Women's Health: womenshealth.gov/pregnancy Contact a health care provider if you have:  Dizziness.  A fever.  Mild pelvic cramps, pelvic pressure, or nagging pain in the abdominal area.  Nausea, vomiting, or diarrhea that lasts for 24 hours or longer.  A bad-smelling vaginal discharge.  Pain when you urinate.  Known exposure to a contagious illness, such as chickenpox, measles, Zika virus, HIV, or hepatitis. Get help right away if you have:  Spotting or bleeding from your  vagina.  Severe abdominal cramping or pain.  Shortness of breath or chest pain.  Any kind of trauma, such as from a fall or a car crash.  New or increased pain, swelling, or redness in an arm or leg. Summary  The first trimester of pregnancy starts on the first day of your last menstrual period until the end of week 12 (months 1 through 3).  Eating 4 or 5 small meals a day rather than 3 large meals may help to relieve nausea and vomiting.  Do not use any products that contain nicotine or tobacco, such as cigarettes, e-cigarettes, and chewing tobacco. If you need help quitting, ask your health care provider.  Keep all follow-up visits. This is important. This information is not intended to replace advice given to you by your health care provider. Make sure you discuss any questions you have with your health care provider. Document Revised: 08/28/2019 Document Reviewed: 07/04/2019 Elsevier Patient Education  2021 Elsevier Inc.  

## 2020-08-06 NOTE — Progress Notes (Signed)
  Subjective:had dizziness and increase BP yesterday    Maria Orr is a G5P0040 [redacted]w[redacted]d being seen today for her first obstetrical visit.  Her obstetrical history is significant for h/o ectopic treated medically. Patient does intend to breast feed. Pregnancy history fully reviewed.  Patient reports nausea and dizziness.  Vitals:   08/06/20 1002  BP: 129/75  Pulse: 92  Weight: 132 lb (59.9 kg)    HISTORY: OB History  Gravida Para Term Preterm AB Living  5 0     4 0  SAB IAB Ectopic Multiple Live Births  2   1   0    # Outcome Date GA Lbr Len/2nd Weight Sex Delivery Anes PTL Lv  5 Current           4 SAB 04/01/19 [redacted]w[redacted]d         3 Ectopic 11/21/15 [redacted]w[redacted]d         2 AB 02/13/15 [redacted]w[redacted]d   M    FD  1 SAB      SAB      Past Medical History:  Diagnosis Date  . Arteriovenous malformation    Right arm   . Chlamydia   . Hx MRSA infection   . 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 Surgery R arm Right    Remove blood clots.   Family History  Problem Relation Age of Onset  . Neuropathy Mother   . Cancer Brother      Exam    Uterus:     Pelvic Exam:    Perineum: No Hemorrhoids   Vulva: normal   Vagina:  normal mucosa   pH:     Cervix: anteverted and no lesions   Adnexa: normal adnexa   Bony Pelvis: average  System: Breast:  normal appearance, no masses or tenderness   Skin: normal coloration and turgor, no rashes    Neurologic: oriented, normal mood   Extremities: normal strength, tone, and muscle mass   HEENT PERRLA and thyroid without masses   Mouth/Teeth mucous membranes moist, pharynx normal without lesions   Neck supple   Cardiovascular: regular rate and rhythm, no murmurs or gallops   Respiratory:  appears well, vitals normal, no respiratory distress, acyanotic, normal RR, neck free of mass or lymphadenopathy, chest clear, no wheezing, crepitations, rhonchi, normal symmetric air entry   Abdomen:  soft, non-tender; bowel sounds normal; no masses,  no organomegaly   Urinary: urethral meatus normal      Assessment:    Pregnancy: G5P0040 Patient Active Problem List   Diagnosis Date Noted  . Supervision of high risk pregnancy, antepartum 07/30/2020  . Ectopic pregnancy 11/21/2015    Class: Acute  . Spontaneous pregnancy loss 02/14/2015  . Abortion, inevitable 02/13/2015  . 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 Prenatal vitamins. Problem list reviewed and updated. Genetic Screening discussed ordered.  Ultrasound discussed; fetal survey: ordered.  Follow up in 4 weeks. 50% of 30 min visit spent on counseling and coordination of care.  CMP and A1c, Pr/Cr   Scheryl Darter 08/06/2020

## 2020-08-07 LAB — COMPREHENSIVE METABOLIC PANEL
ALT: 26 IU/L (ref 0–32)
AST: 15 IU/L (ref 0–40)
Albumin/Globulin Ratio: 1.5 (ref 1.2–2.2)
Albumin: 4.4 g/dL (ref 3.9–5.0)
Alkaline Phosphatase: 52 IU/L (ref 44–121)
BUN/Creatinine Ratio: 10 (ref 9–23)
BUN: 6 mg/dL (ref 6–20)
Bilirubin Total: <0.2 mg/dL (ref 0.0–1.2)
CO2: 19 mmol/L — ABNORMAL LOW (ref 20–29)
Calcium: 9.2 mg/dL (ref 8.7–10.2)
Chloride: 103 mmol/L (ref 96–106)
Creatinine, Ser: 0.62 mg/dL (ref 0.57–1.00)
Globulin, Total: 3 g/dL (ref 1.5–4.5)
Glucose: 90 mg/dL (ref 65–99)
Potassium: 3.7 mmol/L (ref 3.5–5.2)
Sodium: 139 mmol/L (ref 134–144)
Total Protein: 7.4 g/dL (ref 6.0–8.5)
eGFR: 127 mL/min/{1.73_m2} (ref >59)

## 2020-08-07 LAB — OBSTETRIC PANEL, INCLUDING HIV
Antibody Screen: NEGATIVE
Basophils Absolute: 0 10*3/uL (ref 0.0–0.2)
Basos: 0 %
EOS (ABSOLUTE): 0.1 10*3/uL (ref 0.0–0.4)
Eos: 0 %
HIV Screen 4th Generation wRfx: NONREACTIVE
Hematocrit: 39.6 % (ref 34.0–46.6)
Hemoglobin: 13.3 g/dL (ref 11.1–15.9)
Hepatitis B Surface Ag: NEGATIVE
Immature Grans (Abs): 0 10*3/uL (ref 0.0–0.1)
Immature Granulocytes: 0 %
Lymphocytes Absolute: 2.8 10*3/uL (ref 0.7–3.1)
Lymphs: 24 %
MCH: 30.6 pg (ref 26.6–33.0)
MCHC: 33.6 g/dL (ref 31.5–35.7)
MCV: 91 fL (ref 79–97)
Monocytes Absolute: 1.1 10*3/uL — ABNORMAL HIGH (ref 0.1–0.9)
Monocytes: 9 %
Neutrophils Absolute: 7.6 10*3/uL — ABNORMAL HIGH (ref 1.4–7.0)
Neutrophils: 67 %
Platelets: 343 10*3/uL (ref 150–450)
RBC: 4.35 x10E6/uL (ref 3.77–5.28)
RDW: 13.6 % (ref 11.7–15.4)
RPR Ser Ql: NONREACTIVE
Rh Factor: POSITIVE
Rubella Antibodies, IGG: 5.94 index (ref >0.99)
WBC: 11.6 10*3/uL — ABNORMAL HIGH (ref 3.4–10.8)

## 2020-08-07 LAB — HEPATITIS C ANTIBODY: Hep C Virus Ab: <0.1 s/co ratio (ref 0.0–0.9)

## 2020-08-07 LAB — PROTEIN / CREATININE RATIO, URINE
Creatinine, Urine: 170.5 mg/dL
Protein, Ur: 13.2 mg/dL
Protein/Creat Ratio: 77 mg/g creat (ref 0–200)

## 2020-08-07 LAB — HEMOGLOBIN A1C
Est. average glucose Bld gHb Est-mCnc: 108 mg/dL
Hgb A1c MFr Bld: 5.4 % (ref 4.8–5.6)

## 2020-08-08 LAB — CULTURE, OB URINE

## 2020-08-08 LAB — URINE CULTURE, OB REFLEX: Organism ID, Bacteria: NO GROWTH

## 2020-08-12 ENCOUNTER — Encounter: Payer: Self-pay | Admitting: Obstetrics & Gynecology

## 2020-08-18 ENCOUNTER — Encounter: Payer: Self-pay | Admitting: Obstetrics & Gynecology

## 2020-08-18 ENCOUNTER — Other Ambulatory Visit: Payer: Self-pay

## 2020-08-19 ENCOUNTER — Telehealth: Payer: Self-pay

## 2020-08-19 NOTE — Telephone Encounter (Signed)
Spoke with pt regarding [facility] results she noticed in [place]. Information given for partner genetic screening as pt requested Pt scheduled an appt for genetic counseling.

## 2020-08-26 ENCOUNTER — Inpatient Hospital Stay (HOSPITAL_COMMUNITY): Payer: Medicaid Other

## 2020-08-26 ENCOUNTER — Other Ambulatory Visit: Payer: Self-pay

## 2020-08-26 ENCOUNTER — Encounter: Payer: Self-pay | Admitting: Advanced Practice Midwife

## 2020-08-26 ENCOUNTER — Inpatient Hospital Stay (HOSPITAL_COMMUNITY)
Admission: AD | Admit: 2020-08-26 | Discharge: 2020-08-26 | Disposition: A | Discharge status: Home / Self Care | Payer: Medicaid Other | Attending: Obstetrics and Gynecology | Admitting: Obstetrics and Gynecology

## 2020-08-26 ENCOUNTER — Encounter (HOSPITAL_COMMUNITY): Payer: Self-pay | Admitting: Emergency Medicine

## 2020-08-26 DIAGNOSIS — Z3A13 13 weeks gestation of pregnancy: Secondary | ICD-10-CM

## 2020-08-26 DIAGNOSIS — O209 Hemorrhage in early pregnancy, unspecified: Secondary | ICD-10-CM | POA: Insufficient documentation

## 2020-08-26 DIAGNOSIS — R102 Pelvic and perineal pain: Secondary | ICD-10-CM | POA: Diagnosis not present

## 2020-08-26 DIAGNOSIS — O2621 Pregnancy care for patient with recurrent pregnancy loss, first trimester: Secondary | ICD-10-CM | POA: Insufficient documentation

## 2020-08-26 DIAGNOSIS — O26899 Other specified pregnancy related conditions, unspecified trimester: Secondary | ICD-10-CM

## 2020-08-26 DIAGNOSIS — O26891 Other specified pregnancy related conditions, first trimester: Secondary | ICD-10-CM | POA: Diagnosis not present

## 2020-08-26 DIAGNOSIS — O4692 Antepartum hemorrhage, unspecified, second trimester: Secondary | ICD-10-CM

## 2020-08-26 LAB — URINALYSIS, ROUTINE W REFLEX MICROSCOPIC
Bacteria, UA: NONE SEEN
Bilirubin Urine: NEGATIVE
Glucose, UA: NEGATIVE mg/dL
Ketones, ur: NEGATIVE mg/dL
Leukocytes,Ua: NEGATIVE
Nitrite: NEGATIVE
Protein, ur: NEGATIVE mg/dL
Specific Gravity, Urine: 1.011 (ref 1.005–1.030)
pH: 6 (ref 5.0–8.0)

## 2020-08-26 NOTE — ED Triage Notes (Signed)
Patient is [redacted] weeks pregnant G5P0 woke up with abdominal cramping and vaginal bleeding .

## 2020-08-26 NOTE — MAU Provider Note (Signed)
Chief Complaint: [redacted] Weeks Pregnant/Bleeding/Cramping, Vaginal Bleeding, and Pelvic Pain     SUBJECTIVE HPI: Maria Orr is a 25 y.o. [id number] at 55w2dby LMP who presents to maternity admissions reporting waking up with some mild pelvic cramping and saw some light red blood.  Filled one pad then stopped.  . She denies vaginal itching/burning, urinary symptoms, h/a, dizziness, n/v, or fever/chills.    Over the past few hours bleeding has stopped and pain has subsided  Vaginal Bleeding The patient's primary symptoms include pelvic pain and vaginal bleeding. The patient's pertinent negatives include no genital itching, genital lesions or genital odor. This is a new problem. The current episode started today. The problem has been resolved. The patient is experiencing no pain. She is pregnant. Pertinent negatives include no constipation, diarrhea, dysuria, fever, frequency, nausea or vomiting. The vaginal discharge was bloody. The vaginal bleeding is lighter than menses. She has not been passing clots. She has not been passing tissue. Nothing aggravates the symptoms. She has tried nothing for the symptoms.  Pelvic Pain The patient's primary symptoms include pelvic pain and vaginal bleeding. The patient's pertinent negatives include no genital itching, genital lesions or genital odor. Pertinent negatives include no constipation, diarrhea, dysuria, fever, frequency, nausea or vomiting.   RN Note: Pt reports she woke up at 0100 with vaginal bleeding and pelvic pain. Pt has history of SAB x 4 and was worried  Past Medical History:  Diagnosis Date  . Arteriovenous malformation    Right arm   . Chlamydia   . Hx MRSA infection   . 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 Surgery R arm Right    Remove blood clots.   Social History   Socioeconomic History  . Marital status: Single    Spouse name: Not on file  . Number of  children: Not on file  . Years of education: Not on file  . Highest education level: Not on file  Occupational History  . Not on file  Tobacco Use  . Smoking status: Never Smoker  . Smokeless tobacco: Never Used  Vaping Use  . Vaping Use: Never used  Substance and Sexual Activity  . Alcohol use: No  . Drug use: No  . Sexual activity: Yes    Birth control/protection: None  Other Topics Concern  . Not on file  Social History Narrative  . Not on file   Social Determinants of Health   Financial Resource Strain: Not on file  Food Insecurity: Not on file  Transportation Needs: Not on file  Physical Activity: Not on file  Stress: Not on file  Social Connections: Not on file  Intimate Partner Violence: Not on file   No current facility-administered medications on file prior to encounter.   Current Outpatient Medications on File Prior to Encounter  Medication Sig Dispense Refill  . Prenatal Vit-Fe Fumarate-FA (PRENATAL COMPLETE) 14-0.4 MG TABS Take 1 tablet by mouth daily.    . Blood Pressure Monitoring (BLOOD PRESSURE KIT) DEVI Monitor Blood Pressure Readings at Home Regularly 1 each 0  . Calcium Carbonate-Vitamin D (CALCIUM-VITAMIN D3 PO) Take 1 tablet by mouth daily. (Patient not taking: No sig reported)    . metroNIDAZOLE (FLAGYL) 500 MG tablet Take 1 tablet (500 mg total) by mouth 2 (two) times daily. 14 tablet 0  . Misc. Devices (GOJJI WEIGHT SCALE) MISC Monitor weight at home regularly 1 each 0  . Prenatal  Vit-Fe Fumarate-FA (PRENATAL VITAMINS PLUS) 27-1 MG TABS Take 1 tablet by mouth daily. 30 tablet 11  . Prenatal Vit-Fe Fumarate-FA (PREPLUS) 27-1 MG TABS Take 1 tablet by mouth daily. 30 tablet 13  . promethazine (PHENERGAN) 25 MG tablet Take 0.5-1 tablets (12.5-25 mg total) by mouth every 6 (six) hours as needed. 30 tablet 0   No Known Allergies  I have reviewed patient's Past Medical Hx, Surgical Hx, Family Hx, Social Hx, medications and allergies.   ROS:  Review of  Systems  Constitutional: Negative for fever.  Gastrointestinal: Negative for constipation, diarrhea, nausea and vomiting.  Genitourinary: Positive for pelvic pain and vaginal bleeding. Negative for dysuria and frequency.   Review of Systems  Other systems negative   Physical Exam  Physical Exam Patient Vitals for the past 24 hrs:  BP Temp Temp src Pulse Resp SpO2 Height Weight  08/26/20 0306 110/77 98.4 F (36.9 C) Oral (!) 102 15 98 % _0  (1.575 m) 60.3 kg  08/26/20 0156 (!) 124/93 (!) 97.4 F (36.3 C) Oral 95 16 100 % -- --   Constitutional: Well-developed, well-nourished female in no acute distress.  Cardiovascular: normal rate Respiratory: normal effort GI: Abd soft, non-tender.  MS: Extremities nontender, no edema, normal ROM Neurologic: Alert and oriented x 4.  GU: Neg CVAT.  PELVIC EXAM: deferred. I expedited progress into [place] for quick evaluation of fetal viability.   LAB RESULTS Results for orders placed or performed during the hospital encounter of 08/26/20 (from the past 24 hour(s))  Urinalysis, Routine w reflex microscopic Urine, Clean Catch     Status: Abnormal   Collection Time: 08/26/20  3:23 AM  Result Value Ref Range   Color, Urine STRAW (A) YELLOW   APPearance CLEAR CLEAR   Specific Gravity, Urine 1.011 1.005 - 1.030   pH 6.0 5.0 - 8.0   Glucose, UA NEGATIVE NEGATIVE mg/dL   Hgb urine dipstick MODERATE (A) NEGATIVE   Bilirubin Urine NEGATIVE NEGATIVE   Ketones, ur NEGATIVE NEGATIVE mg/dL   Protein, ur NEGATIVE NEGATIVE mg/dL   Nitrite NEGATIVE NEGATIVE   Leukocytes,Ua NEGATIVE NEGATIVE   RBC / HPF 0-5 0 - 5 RBC/hpf   WBC, UA 0-5 0 - 5 WBC/hpf   Bacteria, UA NONE SEEN NONE SEEN   Squamous Epithelial / LPF 0-5 0 - 5    A/Positive/-- (05/05 1317)  IMAGING US OB Comp Less 14 Wks  Result Date: 08/26/2020 CLINICAL DATA:  Pregnant, vaginal bleeding, pelvic pain. LMP 05/25/2020 EXAM: OBSTETRIC <14 WK ULTRASOUND TECHNIQUE: Transabdominal ultrasound was  performed for evaluation of the gestation as well as the maternal uterus and adnexal regions. COMPARISON:  08/06/2020 FINDINGS: Intrauterine gestational sac: Present, single Yolk sac:  Not visualized Embryo:  Present, single Cardiac Activity: Present, regular Heart Rate: 151 bpm MSD: Appropriate given fetal size CRL:   70 mm   13 w 1 d                  [place] EDC: 03/02/2021 Subchorionic hemorrhage:  None visualized. Maternal uterus/adnexae: The uterus is anteverted. The placenta is seen along the anterior wall and appears separate from the internal cervical os. The cervix is closed. No intrauterine masses are seen. There is no free fluid within the pelvis. The maternal ovaries are unremarkable. IMPRESSION: Single living intrauterine gestation with an estimated gestational age of [redacted] weeks, 1 day. Appropriate interval growth. Electronically Signed   By: Fidela Salisbury MD   On: 08/26/2020 03:45    MAU  Management/MDM: Ordered Ultrasound to rule out SAB.. This was normal and patient was reassured that fetus was growing and still viable.  Discussed there is probably a tiny [facility], which will likely resolve.  She states she is being followed by Dr Roselie Awkward for history of 17 wk loss.    ASSESSMENT 1. Bleeding in early pregnancy   2. Vaginal bleeding in pregnancy, second trimester     PLAN Discharge home Reassured that baby is doing well so far  Pt stable at time of discharge. Encouraged to return here if she develops worsening of symptoms, increase in pain, fever, or other concerning symptoms.    Hansel Feinstein CNM, MSN Certified Nurse-Midwife 08/26/2020  4:10 AM

## 2020-08-26 NOTE — ED Provider Notes (Signed)
[facility] EMERGENCY DEPARTMENT Provider Note   CSN: [id number] Arrival date & time: 08/26/20  0152     History Chief Complaint  Patient presents with  . [redacted] Weeks Pregnant/Bleeding/Cramping    Maria Orr is a 25 y.o. female.  Patient presents to the ED with a chief complaint of vaginal bleeding and abdominal cramping.  She states this is her 5th pregnancy.  All previous have been miscarriages.  She is followed by the high-risk clinic.  Denies any treatment prior to arrival.  Reports having some mild pain.  The history is provided by the patient. No language interpreter was used.       Past Medical History:  Diagnosis Date  . Arteriovenous malformation    Right arm   . Chlamydia   . Hx MRSA infection   . 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 Supervision of high risk pregnancy, antepartum 07/30/2020  . Ectopic pregnancy 11/21/2015    Class: Acute  . Spontaneous pregnancy loss 02/14/2015  . Abortion, inevitable 02/13/2015  . 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 Surgery R arm Right    Remove blood clots.     OB History    Gravida  5   Para  0   Term      Preterm      AB  4   Living  0     SAB  2   IAB      Ectopic  1   Multiple      Live Births  0           Family History  Problem Relation Age of Onset  . Neuropathy Mother   . Cancer Brother     Social History   Tobacco Use  . Smoking status: Never Smoker  . Smokeless tobacco: Never Used  Vaping Use  . Vaping Use: Never used  Substance Use Topics  . Alcohol use: No  . Drug use: No    Home Medications Prior to Admission medications   Medication Sig Start Date End Date Taking? Authorizing Provider  Blood Pressure Monitoring (BLOOD PRESSURE KIT) DEVI Monitor Blood Pressure Readings at Home Regularly 07/30/20    Constant, Peggy, MD  Calcium Carbonate-Vitamin D (CALCIUM-VITAMIN D3 PO) Take 1 tablet by mouth daily. Patient not taking: No sig reported    [provider]  metroNIDAZOLE (FLAGYL) 500 MG tablet Take 1 tablet (500 mg total) by mouth 2 (two) times daily. 04/01/19   Lajean Manes, CNM  Misc. Devices (GOJJI WEIGHT SCALE) MISC Monitor weight at home regularly 07/30/20   Constant, Peggy, MD  Prenatal Vit-Fe Fumarate-FA (PRENATAL VITAMINS PLUS) 27-1 MG TABS Take 1 tablet by mouth daily. 01/04/15   Jaclyn Prime, Collene Leyden, PA-C  Prenatal Vit-Fe Fumarate-FA (PREPLUS) 27-1 MG TABS Take 1 tablet by mouth daily. 07/30/20   Constant, Peggy, MD  promethazine (PHENERGAN) 25 MG tablet Take 0.5-1 tablets (12.5-25 mg total) by mouth every 6 (six) hours as needed. 11/21/15   Leftwich-Kirby, Kathie Dike, CNM    Allergies    Patient has no known allergies.  Review of Systems   Review of Systems  All other systems reviewed and are negative.   Physical Exam Updated Vital Signs BP (!) 124/93 (BP Location: Left Arm)   Pulse 95   Temp (!) 97.4 F (36.3  C) (Oral)   Resp 16   LMP 05/25/2020   SpO2 100%   Physical Exam Vitals and nursing note reviewed.  Constitutional:      General: She is not in acute distress.    Appearance: She is well-developed.  HENT:     Head: Normocephalic and atraumatic.  Eyes:     Conjunctiva/sclera: Conjunctivae normal.  Cardiovascular:     Rate and Rhythm: Normal rate.     Heart sounds: No murmur heard.   Pulmonary:     Effort: Pulmonary effort is normal. No respiratory distress.  Abdominal:     General: There is no distension.  Musculoskeletal:     Cervical back: Neck supple.     Comments: Moves all extremities  Skin:    General: Skin is warm and dry.  Neurological:     Mental Status: She is alert and oriented to person, place, and time.  Psychiatric:        Mood and Affect: Mood normal.        Behavior: Behavior normal.     ED Results / Procedures /  Treatments   Labs (all labs ordered are listed, but only abnormal results are displayed) Labs Reviewed - No data to display  EKG None  Radiology No results found.  Procedures Procedures   Medications Ordered in ED Medications - No data to display  ED Course  I have reviewed the triage vital signs and the nursing notes.  Pertinent labs & imaging results that were available during my care of the patient were reviewed by me and considered in my medical decision making (see chart for details).    MDM Rules/Calculators/A&P                          Patient here with vaginal bleeding in pregnancy.  Case discussed with MAU APP, who accepts in transfer. Final Clinical Impression(s) / ED Diagnoses Final diagnoses:  Bleeding in early pregnancy    Rx / DC Orders ED Discharge Orders    None       Montine Circle, PA-C 08/26/20 0209    Merrily Pew, MD 08/26/20 [phone]

## 2020-08-26 NOTE — Discharge Instructions (Signed)
Subchorionic Hematoma (we did not see this on [place] but it may be source of bleeding)  A hematoma is a collection of blood outside of the blood vessels. A subchorionic hematoma is a collection of blood between the outer wall of the embryo (chorion) and the inner wall of the uterus. This condition can cause vaginal bleeding. Early small hematomas usually shrink on their own and do not affect your baby or pregnancy. When bleeding starts later in pregnancy, or if the hematoma is larger or occurs in older pregnant women, the condition may be more serious. Larger hematomas increase the chances of miscarriage. This condition also increases the risk of:  Premature separation of the placenta from the uterus.  Premature (preterm) labor.  Stillbirth. What are the causes? The exact cause of this condition is not known. It occurs when blood is trapped between the placenta and the uterine wall because the placenta has separated from the original site of implantation. What increases the risk? You are more likely to develop this condition if:  You were treated with fertility medicines.  You became pregnant through in vitro fertilization (IVF). What are the signs or symptoms? Symptoms of this condition include:  Vaginal spotting or bleeding.  Abdominal pain. This is rare. Sometimes you may have no symptoms and the bleeding may only be seen when ultrasound images are taken (transvaginal ultrasound). How is this diagnosed? This condition is diagnosed based on a physical exam. This includes a pelvic exam. You may also have other tests, including:  Blood tests.  Urine tests.  Ultrasound of the abdomen. How is this treated? Treatment for this condition can vary. Treatment may include:  Watchful waiting. You will be monitored closely for any changes in bleeding.  Medicines.  Activity restriction. This may be needed until the bleeding stops.  A medicine called Rh immunoglobulin. This is given if you  have an Rh-negative blood type. It prevents Rh sensitization. Follow these instructions at home:  Stay on bed rest if told to do so by your health care provider.  Do not lift anything that is heavier than 10 lb (4.5 kg), or the limit that you are told by your health care provider.  Track and write down the number of pads you use each day and how soaked (saturated) they are.  Do not use tampons.  Keep all follow-up visits. This is important. Your health care provider may ask you to have follow-up blood tests or ultrasound tests or both. Contact a health care provider if:  You have any vaginal bleeding.  You have a fever. Get help right away if:  You have severe cramps in your stomach, back, abdomen, or pelvis.  You pass large clots or tissue. Save any tissue for your health care provider to look at.  You faint.  You become light-headed or weak. Summary  A subchorionic hematoma is a collection of blood between the outer wall of the embryo (chorion) and the inner wall of the uterus.  This condition can cause vaginal bleeding.  Sometimes you may have no symptoms and the bleeding may only be seen when ultrasound images are taken.  Treatment may include watchful waiting, medicines, or activity restriction.  Keep all follow-up visits. Get help right away if you have severe cramps or heavy vaginal bleeding. This information is not intended to replace advice given to you by your health care provider. Make sure you discuss any questions you have with your health care provider. Document Revised: 12/16/2019 Document Reviewed: 12/16/2019  Elsevier Patient Education  2021 Elsevier Inc[employer]  

## 2020-08-26 NOTE — Progress Notes (Signed)
Wynelle Bourgeois CNM in earlier to discuss lab and u/s results. Written and verbal d/c instructions given and understanding voiced.

## 2020-08-26 NOTE — MAU Note (Signed)
Pt reports she woke up at 0100 with vaginal bleeding and pelvic pain. Pt has history of SAB x 4 and was worried.

## 2020-09-03 ENCOUNTER — Other Ambulatory Visit: Payer: Self-pay

## 2020-09-03 ENCOUNTER — Ambulatory Visit (INDEPENDENT_AMBULATORY_CARE_PROVIDER_SITE_OTHER): Payer: Medicaid Other | Admitting: Advanced Practice Midwife

## 2020-09-03 VITALS — BP 124/74 | HR 94 | Wt 137.2 lb

## 2020-09-03 DIAGNOSIS — Z8759 Personal history of other complications of pregnancy, childbirth and the puerperium: Secondary | ICD-10-CM | POA: Insufficient documentation

## 2020-09-03 DIAGNOSIS — Z148 Genetic carrier of other disease: Secondary | ICD-10-CM

## 2020-09-03 DIAGNOSIS — Z8742 Personal history of other diseases of the female genital tract: Secondary | ICD-10-CM

## 2020-09-03 DIAGNOSIS — O09299 Supervision of pregnancy with other poor reproductive or obstetric history, unspecified trimester: Secondary | ICD-10-CM

## 2020-09-03 DIAGNOSIS — Z3A14 14 weeks gestation of pregnancy: Secondary | ICD-10-CM

## 2020-09-03 DIAGNOSIS — O0990 Supervision of high risk pregnancy, unspecified, unspecified trimester: Secondary | ICD-10-CM

## 2020-09-03 NOTE — Patient Instructions (Signed)

## 2020-09-03 NOTE — Progress Notes (Signed)
   PRENATAL VISIT NOTE  Subjective:  Maria Orr is a 25 y.o. G5P0040 at [redacted]w[redacted]d being seen today for ongoing prenatal care.  She is currently monitored for the following issues for this high-risk pregnancy and has AVM (congenital arteriovenous malformation); Spontaneous pregnancy loss; Supervision of high risk pregnancy, antepartum; History of gonorrhea; Preterm premature rupture of membranes (PPROM) delivered, current hospitalization; Venous vascular malformations; History of ectopic pregnancy; History of cervical incompetence; Pregnancy with poor obstetric history; and Carrier of spinal muscular atrophy on their problem list.  Patient reports no complaints.  Contractions: Not present. Vag. Bleeding: None.   . Denies leaking of fluid.   The following portions of the patient's history were reviewed and updated as appropriate: allergies, current medications, past family history, past medical history, past social history, past surgical history and problem list.   Objective:   Vitals:   09/03/20 0901  BP: 124/74  Pulse: 94  Weight: 137 lb 3.2 oz (62.2 kg)    Fetal Status: Fetal Heart Rate (bpm): 150         General:  Alert, oriented and cooperative. Patient is in no acute distress.  Skin: Skin is warm and dry. No rash noted.   Cardiovascular: Normal heart rate noted  Respiratory: Normal respiratory effort, no problems with respiration noted  Abdomen: Soft, gravid, appropriate for gestational age.  Pain/Pressure: Absent     Pelvic: Cervical exam deferred        Extremities: Normal range of motion.  Edema: None  Mental Status: Normal mood and affect. Normal behavior. Normal judgment and thought content.   Assessment and Plan:  Pregnancy: G5P0040 at [redacted]w[redacted]d  1. Supervision of high risk pregnancy, antepartum --Anticipatory guidance about next visits/weeks of pregnancy given. --Next visit in 4 weeks with AFP  - [place] MFM OB DETAIL +14 WK; Future  2. [redacted] weeks gestation of pregnancy   3.  History of preterm premature rupture of membranes (PPROM) --Hx PPROM at 17 weeks, ? Incompetent cervix --Consult Dr Debroah Loop today, pt to see MFM to for cervical length/consult about management  - [place] MFM OB Transvaginal; Future - [place] MFM OB DETAIL +14 WK; Future  4. History of cervical incompetence --?PPROM vs cervical incompetence at 17 weeks  - [place] MFM OB Transvaginal; Future  5. Pregnancy with poor obstetric history --Ectopic, SAB, and 17 week PPROM with previable delivery - [place] MFM OB Transvaginal; Future  6. Carrier of spinal muscular atrophy --Pt spoke with Avelina Laine and was offered partner testing but wants to have his lab drawn in person, not have phlebotomist come to her home. Discussed options with MFM and pt to go to genetics counseling with MFM and discuss partner testing.   - [facility] MFM GENETICS REFERRAL  Preterm labor symptoms and general obstetric precautions including but not limited to vaginal bleeding, contractions, leaking of fluid and fetal movement were reviewed in detail with the patient. Please refer to After Visit Summary for other counseling recommendations.   Return in about 4 weeks (around 10/01/2020).  Future Appointments  Date Time Provider Department Center  09/17/2020  1:00 PM [facility] NURSE [facility]  09/17/2020  1:15 PM [facility] US2 [facility]  09/17/2020  2:30 PM [facility] GENETIC COUNSELING RM [facility]  10/07/2020  9:30 AM [facility] NURSE [facility]  10/07/2020  9:45 AM [facility] US4 [facility]    Sharen Counter, CNM

## 2020-09-03 NOTE — Progress Notes (Signed)
Pt has not started feeling movement, denies any cramping or bleeding today.

## 2020-09-09 ENCOUNTER — Encounter: Payer: Self-pay | Admitting: Obstetrics & Gynecology

## 2020-09-17 ENCOUNTER — Ambulatory Visit: Payer: Medicaid Other

## 2020-09-17 ENCOUNTER — Ambulatory Visit: Payer: Medicaid Other | Attending: Advanced Practice Midwife

## 2020-09-17 ENCOUNTER — Ambulatory Visit: Payer: Medicaid Other | Admitting: *Deleted

## 2020-09-17 ENCOUNTER — Ambulatory Visit (HOSPITAL_BASED_OUTPATIENT_CLINIC_OR_DEPARTMENT_OTHER): Payer: Medicaid Other | Admitting: Obstetrics

## 2020-09-17 ENCOUNTER — Other Ambulatory Visit: Payer: Self-pay | Admitting: Advanced Practice Midwife

## 2020-09-17 ENCOUNTER — Encounter: Payer: Self-pay | Admitting: *Deleted

## 2020-09-17 ENCOUNTER — Other Ambulatory Visit: Payer: Self-pay

## 2020-09-17 ENCOUNTER — Other Ambulatory Visit: Payer: Self-pay | Admitting: *Deleted

## 2020-09-17 VITALS — BP 116/67 | HR 78

## 2020-09-17 DIAGNOSIS — Z8759 Personal history of other complications of pregnancy, childbirth and the puerperium: Secondary | ICD-10-CM | POA: Diagnosis not present

## 2020-09-17 DIAGNOSIS — O42919 Preterm premature rupture of membranes, unspecified as to length of time between rupture and onset of labor, unspecified trimester: Secondary | ICD-10-CM | POA: Diagnosis present

## 2020-09-17 DIAGNOSIS — O99891 Other specified diseases and conditions complicating pregnancy: Secondary | ICD-10-CM | POA: Diagnosis not present

## 2020-09-17 DIAGNOSIS — O09212 Supervision of pregnancy with history of pre-term labor, second trimester: Secondary | ICD-10-CM | POA: Diagnosis not present

## 2020-09-17 DIAGNOSIS — O0990 Supervision of high risk pregnancy, unspecified, unspecified trimester: Secondary | ICD-10-CM

## 2020-09-17 DIAGNOSIS — Z3A16 16 weeks gestation of pregnancy: Secondary | ICD-10-CM | POA: Insufficient documentation

## 2020-09-17 DIAGNOSIS — O09299 Supervision of pregnancy with other poor reproductive or obstetric history, unspecified trimester: Secondary | ICD-10-CM

## 2020-09-17 DIAGNOSIS — O3430 Maternal care for cervical incompetence, unspecified trimester: Secondary | ICD-10-CM | POA: Insufficient documentation

## 2020-09-17 DIAGNOSIS — Z8742 Personal history of other diseases of the female genital tract: Secondary | ICD-10-CM | POA: Diagnosis not present

## 2020-09-17 DIAGNOSIS — O039 Complete or unspecified spontaneous abortion without complication: Secondary | ICD-10-CM

## 2020-09-17 DIAGNOSIS — Q2731 Arteriovenous malformation of vessel of upper limb: Secondary | ICD-10-CM | POA: Diagnosis not present

## 2020-09-17 NOTE — Progress Notes (Signed)
MFM Consult Note  Maria Orr is a 25 year old gravida 5 para 0 who was seen for a cervical length measurement and consultation due to a history of multiple prior losses.  The patient reports that her first pregnancy resulted in a loss at around 17 weeks after she spontaneously ruptured membranes and delivered.  She reports that she started prenatal care late in that pregnancy and did not have any vaginal bleeding.  Her next 3 pregnancies were all first trimester miscarriages.    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  The patient reports that she continues to experience blood clots in her right upper extremity due to the AV malformation.  She is currently followed by vascular surgery at [facility].  They have advised her not to take aspirin and they have not recommended any anticoagulation as the blood clots occur in the right upper extremity only and do not travel anywhere else.  As this is a complex case, the vascular surgeons are planning to send her to a surgeon in [place] to remove the AV malformation after delivery.  She denies any history of a DVT or pulmonary embolus.  The patient has screened positive as a carrier for spinal muscular atrophy (SMA).  The father of the baby has screened negative as a carrier for SMA, therefore her fetus should not be at risk for this condition.    She had a cell free DNA test earlier in her pregnancy that showed a low risk for trisomy 24, 66, and 13.  A female fetus is predicted.  A limited ultrasound performed today shows that the fetus is in the vertex presentation.  There was normal amniotic fluid.  A transvaginal ultrasound performed today shows a cervical length of 3.84 cm long without any signs of funneling.  She was reassured that based on this normal cervical length measurement; her risk of a preterm birth is low at this time.  During our consultation  today, the following were discussed:  History of PPROM at 17+ weeks with previable delivery  The patient was advised that as her previable delivery was caused by PPROM and her cervical length is within normal limits today, a cervical cerclage is not recommended for her current pregnancy.  She was advised that studies have indicated that women with a history of a preterm birth due to PPROM may decrease their risk of another preterm birth with the use of the weekly progesterone injections.  Due to her history of multiple blood clots in her arm, I would not recommend the weekly progesterone injections as it may increase her risk of further blood clots.  We will continue to follow her with serial cervical length measurements and will continue expectant observation for now.  She will return to our office in 3 weeks for a detailed fetal anatomy scan.  We will reassess the cervical length again at that time.    We will continue to follow her with growth ultrasounds throughout her pregnancy.  AV malformation in her right upper extremity  The patient was advised to continue close follow-up with her vascular surgeons throughout her pregnancy to determine if any urgent treatment is necessary for the AV malformations in her arm.  Her vascular surgeons have recommended against aspirin and anticoagulation.  She was advised that should she require any further imaging studies of the AV malformations in her arm, an MRI without contrast is safe in pregnancy.  At  the end of the consultation, the patient stated that all her questions have been answered to her complete satisfaction.    A total of 45 minutes was spent counseling and coordinating the care for this patient.  Greater than 50% of the time was spent in direct face-to-face contact.  Recommendations:  No indications for a cervical cerclage at this time Weekly progesterone injections are not recommended Detailed fetal anatomy scan with cervical  length measurement at 19 weeks Monthly growth ultrasounds throughout her pregnancy  Continue management of the AV malformation in her right upper extremity with her vascular surgeons

## 2020-09-25 ENCOUNTER — Encounter: Payer: Self-pay | Admitting: Obstetrics & Gynecology

## 2020-10-01 ENCOUNTER — Encounter: Payer: Medicaid Other | Admitting: Obstetrics and Gynecology

## 2020-10-01 ENCOUNTER — Encounter: Payer: Medicaid Other | Admitting: Advanced Practice Midwife

## 2020-10-07 ENCOUNTER — Encounter: Payer: Self-pay | Admitting: *Deleted

## 2020-10-07 ENCOUNTER — Ambulatory Visit: Payer: Medicaid Other | Admitting: *Deleted

## 2020-10-07 ENCOUNTER — Other Ambulatory Visit: Payer: Self-pay

## 2020-10-07 ENCOUNTER — Other Ambulatory Visit: Payer: Self-pay | Admitting: *Deleted

## 2020-10-07 ENCOUNTER — Ambulatory Visit: Payer: Medicaid Other | Attending: Advanced Practice Midwife

## 2020-10-07 VITALS — BP 132/66 | HR 98

## 2020-10-07 DIAGNOSIS — O3430 Maternal care for cervical incompetence, unspecified trimester: Secondary | ICD-10-CM

## 2020-10-07 DIAGNOSIS — Z8759 Personal history of other complications of pregnancy, childbirth and the puerperium: Secondary | ICD-10-CM | POA: Diagnosis not present

## 2020-10-07 DIAGNOSIS — O09299 Supervision of pregnancy with other poor reproductive or obstetric history, unspecified trimester: Secondary | ICD-10-CM

## 2020-10-07 DIAGNOSIS — O0990 Supervision of high risk pregnancy, unspecified, unspecified trimester: Secondary | ICD-10-CM

## 2020-10-11 ENCOUNTER — Encounter (HOSPITAL_COMMUNITY): Payer: Self-pay | Admitting: Family Medicine

## 2020-10-11 ENCOUNTER — Inpatient Hospital Stay (HOSPITAL_COMMUNITY)
Admission: AD | Admit: 2020-10-11 | Discharge: 2020-10-12 | Disposition: A | Discharge status: Home / Self Care | Payer: Medicaid Other | Attending: Family Medicine | Admitting: Family Medicine

## 2020-10-11 ENCOUNTER — Other Ambulatory Visit: Payer: Self-pay

## 2020-10-11 DIAGNOSIS — N393 Stress incontinence (female) (male): Secondary | ICD-10-CM | POA: Insufficient documentation

## 2020-10-11 DIAGNOSIS — O26892 Other specified pregnancy related conditions, second trimester: Secondary | ICD-10-CM | POA: Diagnosis not present

## 2020-10-11 DIAGNOSIS — O99891 Other specified diseases and conditions complicating pregnancy: Secondary | ICD-10-CM | POA: Diagnosis not present

## 2020-10-11 DIAGNOSIS — Z3A19 19 weeks gestation of pregnancy: Secondary | ICD-10-CM | POA: Diagnosis not present

## 2020-10-11 DIAGNOSIS — R350 Frequency of micturition: Secondary | ICD-10-CM | POA: Diagnosis present

## 2020-10-11 LAB — URINALYSIS, ROUTINE W REFLEX MICROSCOPIC
Bilirubin Urine: NEGATIVE
Glucose, UA: NEGATIVE mg/dL
Hgb urine dipstick: NEGATIVE
Ketones, ur: NEGATIVE mg/dL
Leukocytes,Ua: NEGATIVE
Nitrite: NEGATIVE
Protein, ur: NEGATIVE mg/dL
Specific Gravity, Urine: 1.003 — ABNORMAL LOW (ref 1.005–1.030)
pH: 7 (ref 5.0–8.0)

## 2020-10-11 LAB — WET PREP, GENITAL
Sperm: NONE SEEN
Trich, Wet Prep: NONE SEEN
Yeast Wet Prep HPF POC: NONE SEEN

## 2020-10-11 LAB — AMNISURE RUPTURE OF MEMBRANE (ROM) NOT AT ARMC: Amnisure ROM: NEGATIVE

## 2020-10-11 NOTE — MAU Note (Signed)
Pt reports she has a history of multiple miscarriages and incompetent cervix. Was seen in office last week and they were going to hold off for now. Has been having pressure and tonight at 2040 she had a gush of fluid. States she has had a few gushes since then. Denies pain.

## 2020-10-11 NOTE — MAU Provider Note (Signed)
Event Date/Time   First Provider Initiated Contact with Patient 10/11/20 2324       S: [name] is a 25 y.o. D5H2992 at [redacted]w[redacted]d who presents to MAU today complaining of leaking of fluid since this morning. Patient reports she feels gushes of fluid only when laughing, sneezing or coughing. Patient reports otherwise she has not experienced gushes of fluid outside of these events and is not wearing a pad. Patient does have a history of PPROM at 17 weeks in a previous pregnancy. She denies vaginal bleeding. She denies contractions. Patient reports frequent urination.  O: BP 121/70 (BP Location: Left Arm)   Pulse 95   Temp 98.1 F (36.7 C) (Oral)   Resp 15   Ht '5\' 2"'  (1.575 m)   Wt 69.4 kg   LMP 05/25/2020   SpO2 100%   BMI 27.98 kg/m   Patient Vitals for the past 24 hrs:  BP Temp Temp src Pulse Resp SpO2 Height Weight  10/11/20 2135 121/70 98.1 F (36.7 C) Oral 95 15 100 % '5\' 2"'  (1.575 m) 69.4 kg    GENERAL: Well-developed, well-nourished female in no acute distress.  HEAD: Normocephalic, atraumatic.  CHEST: Normal effort of breathing, regular heart rate ABDOMEN: Soft, nontender, gravid PELVIC: Normal external female genitalia. Vagina is pink and rugated. Cervix with normal contour, no lesions. Normal discharge.  No pooling.   Cervical exam:  Dilation: Closed Effacement (%): Thick Cervical Position: Posterior Exam by:: NVernice Jefferson NP  FHR 136 UA: colorless/SG 1.003, sending urine for culture based on symptoms AmniSure: negative WetPrep: +ClueCells (isolated finding not requiring treatment) GC/CT collected  Results for orders placed or performed during the hospital encounter of 10/11/20 (from the past 24 hour(s))  Urinalysis, Routine w reflex microscopic     Status: Abnormal   Collection Time: 10/11/20  9:41 PM  Result Value Ref Range   Color, Urine COLORLESS (A) YELLOW   APPearance CLEAR CLEAR   Specific Gravity, Urine 1.003 (L) 1.005 - 1.030   pH 7.0 5.0 -  8.0   Glucose, UA NEGATIVE NEGATIVE mg/dL   Hgb urine dipstick NEGATIVE NEGATIVE   Bilirubin Urine NEGATIVE NEGATIVE   Ketones, ur NEGATIVE NEGATIVE mg/dL   Protein, ur NEGATIVE NEGATIVE mg/dL   Nitrite NEGATIVE NEGATIVE   Leukocytes,Ua NEGATIVE NEGATIVE  [facility]    Status: None   Collection Time: 10/11/20 10:00 PM  Result Value Ref Range   Amnisure ROM NEGATIVE   Wet prep, genital     Status: Abnormal   Collection Time: 10/11/20 10:00 PM  Result Value Ref Range   Yeast Wet Prep HPF POC NONE SEEN NONE SEEN   Trich, Wet Prep NONE SEEN NONE SEEN   Clue Cells Wet Prep HPF POC PRESENT (A) NONE SEEN   WBC, Wet Prep HPF POC MANY (A) NONE SEEN   Sperm NONE SEEN     A: SIUP at 141w6dMembranes intact  P: 1. SUI (stress urinary incontinence, female)   2. Frequent urination   3. [redacted] weeks gestation of pregnancy    Allergies as of 10/11/2020   No Known Allergies      Medication List     TAKE these medications    Blood Pressure Kit Devi Monitor Blood Pressure Readings at Home Regularly   CALCIUM-VITAMIN D3 PO Take 1 tablet by mouth daily.   PrePLUS 27-1 MG Tabs Take 1 tablet by mouth daily.       ASK  your doctor about these medications    Go[place]ji Weight Scale Misc Monitor weight at home regularly   metroNIDAZOLE 500 MG tablet Commonly known as: FLAGYL Take 1 tablet (500 mg total) by mouth 2 (two) times daily.   PreNatal Vitamins Plus 27-1 MG Tabs Take 1 tablet by mouth daily.   Prenatal Complete 14-0.4 MG Tabs Take 1 tablet by mouth daily.   promethazine 25 MG tablet Commonly known as: PHENERGAN Take 0.5-1 tablets (12.5-25 mg total) by mouth every 6 (six) hours as needed.       -referral to pelvic PT -referral to UroGYN -information on kegel exercises given -return MAU precautions given -pt discharged to home in stable condition   Hafley, Gerrie Nordmann, NP 10/11/2020 11:51 PM

## 2020-10-12 LAB — GC/CHLAMYDIA PROBE AMP (~~LOC~~) NOT AT ARMC
Chlamydia: NEGATIVE
Comment: NEGATIVE
Comment: NORMAL
Neisseria Gonorrhea: NEGATIVE

## 2020-10-13 LAB — CULTURE, OB URINE

## 2020-10-20 ENCOUNTER — Other Ambulatory Visit: Payer: Self-pay | Admitting: *Deleted

## 2020-10-20 ENCOUNTER — Ambulatory Visit: Payer: Medicaid Other | Admitting: *Deleted

## 2020-10-20 ENCOUNTER — Ambulatory Visit: Payer: Medicaid Other | Attending: Obstetrics

## 2020-10-20 ENCOUNTER — Encounter: Payer: Self-pay | Admitting: *Deleted

## 2020-10-20 ENCOUNTER — Other Ambulatory Visit: Payer: Self-pay

## 2020-10-20 VITALS — BP 117/70 | HR 93

## 2020-10-20 DIAGNOSIS — O26879 Cervical shortening, unspecified trimester: Secondary | ICD-10-CM

## 2020-10-20 DIAGNOSIS — O3432 Maternal care for cervical incompetence, second trimester: Secondary | ICD-10-CM | POA: Insufficient documentation

## 2020-10-20 DIAGNOSIS — Z148 Genetic carrier of other disease: Secondary | ICD-10-CM

## 2020-10-20 DIAGNOSIS — O09299 Supervision of pregnancy with other poor reproductive or obstetric history, unspecified trimester: Secondary | ICD-10-CM | POA: Diagnosis not present

## 2020-10-20 DIAGNOSIS — Z3A21 21 weeks gestation of pregnancy: Secondary | ICD-10-CM | POA: Diagnosis not present

## 2020-10-20 DIAGNOSIS — O09292 Supervision of pregnancy with other poor reproductive or obstetric history, second trimester: Secondary | ICD-10-CM

## 2020-10-22 ENCOUNTER — Encounter: Payer: Medicaid Other | Admitting: Obstetrics & Gynecology

## 2020-10-27 ENCOUNTER — Ambulatory Visit (INDEPENDENT_AMBULATORY_CARE_PROVIDER_SITE_OTHER): Payer: Medicaid Other | Admitting: Obstetrics & Gynecology

## 2020-10-27 ENCOUNTER — Other Ambulatory Visit: Payer: Self-pay

## 2020-10-27 VITALS — BP 121/78 | HR 105 | Wt 159.1 lb

## 2020-10-27 DIAGNOSIS — O0990 Supervision of high risk pregnancy, unspecified, unspecified trimester: Secondary | ICD-10-CM

## 2020-10-27 DIAGNOSIS — Z8742 Personal history of other diseases of the female genital tract: Secondary | ICD-10-CM

## 2020-10-27 NOTE — Progress Notes (Signed)
   PRENATAL VISIT NOTE  Subjective:  Maria Orr is a 25 y.o. G5P0040 at [redacted]w[redacted]d being seen today for ongoing prenatal care.  She is currently monitored for the following issues for this high-risk pregnancy and has AVM (congenital arteriovenous malformation); Supervision of high risk pregnancy, antepartum; History of gonorrhea; Venous vascular malformations; History of ectopic pregnancy; History of cervical incompetence; Pregnancy with poor obstetric history; Carrier of spinal muscular atrophy; SUI (stress urinary incontinence, female); and Frequent urination on their problem list.  Patient reports no complaints.  Contractions: Not present. Vag. Bleeding: None.  Movement: Present. Denies leaking of fluid.   The following portions of the patient's history were reviewed and updated as appropriate: allergies, current medications, past family history, past medical history, past social history, past surgical history and problem list.   Objective:   Vitals:   10/27/20 0821  BP: 121/78  Pulse: (!) 105  Weight: 159 lb 1.6 oz (72.2 kg)    Fetal Status: Fetal Heart Rate (bpm): 154   Movement: Present     General:  Alert, oriented and cooperative. Patient is in no acute distress.  Skin: Skin is warm and dry. No rash noted.   Cardiovascular: Normal heart rate noted  Respiratory: Normal respiratory effort, no problems with respiration noted  Abdomen: Soft, gravid, appropriate for gestational age.  Pain/Pressure: Absent     Pelvic: Cervical exam deferred        Extremities: Normal range of motion.  Edema: Trace  Mental Status: Normal mood and affect. Normal behavior. Normal judgment and thought content.   Assessment and Plan:  Pregnancy: G5P0040 at [redacted]w[redacted]d 1. Supervision of high risk pregnancy, antepartum Followed by MFM  2. History of cervical incompetence [place] for cx length  Preterm labor symptoms and general obstetric precautions including but not limited to vaginal bleeding, contractions,  leaking of fluid and fetal movement were reviewed in detail with the patient. Please refer to After Visit Summary for other counseling recommendations.   Return in about 5 weeks (around 12/01/2020).  Future Appointments  Date Time Provider Department Center  11/05/2020  3:00 PM [facility] NURSE [facility]  11/05/2020  3:15 PM [facility] US2 [facility]    Scheryl Darter, MD

## 2020-10-27 NOTE — Progress Notes (Signed)
ROB 22.[redacted] wks GA No complaints.

## 2020-11-05 ENCOUNTER — Ambulatory Visit: Payer: Medicaid Other | Attending: Obstetrics

## 2020-11-05 ENCOUNTER — Ambulatory Visit: Payer: Medicaid Other | Admitting: *Deleted

## 2020-11-05 ENCOUNTER — Other Ambulatory Visit: Payer: Self-pay

## 2020-11-05 VITALS — BP 128/72 | HR 113

## 2020-11-05 DIAGNOSIS — O26879 Cervical shortening, unspecified trimester: Secondary | ICD-10-CM | POA: Diagnosis not present

## 2020-11-05 DIAGNOSIS — O09292 Supervision of pregnancy with other poor reproductive or obstetric history, second trimester: Secondary | ICD-10-CM

## 2020-11-05 DIAGNOSIS — Z3A23 23 weeks gestation of pregnancy: Secondary | ICD-10-CM | POA: Diagnosis not present

## 2020-11-05 DIAGNOSIS — O09299 Supervision of pregnancy with other poor reproductive or obstetric history, unspecified trimester: Secondary | ICD-10-CM | POA: Insufficient documentation

## 2020-11-05 DIAGNOSIS — Z8774 Personal history of (corrected) congenital malformations of heart and circulatory system: Secondary | ICD-10-CM

## 2020-11-05 DIAGNOSIS — Z148 Genetic carrier of other disease: Secondary | ICD-10-CM

## 2020-11-06 ENCOUNTER — Other Ambulatory Visit: Payer: Self-pay | Admitting: *Deleted

## 2020-11-06 DIAGNOSIS — Z8774 Personal history of (corrected) congenital malformations of heart and circulatory system: Secondary | ICD-10-CM

## 2020-11-25 ENCOUNTER — Other Ambulatory Visit (HOSPITAL_COMMUNITY): Payer: Self-pay | Admitting: Obstetrics

## 2020-11-25 ENCOUNTER — Ambulatory Visit: Payer: Medicaid Other | Attending: Obstetrics and Gynecology

## 2020-11-25 ENCOUNTER — Ambulatory Visit: Payer: Medicaid Other | Admitting: *Deleted

## 2020-11-25 ENCOUNTER — Encounter: Payer: Self-pay | Admitting: *Deleted

## 2020-11-25 ENCOUNTER — Other Ambulatory Visit: Payer: Self-pay

## 2020-11-25 VITALS — BP 122/73 | HR 98

## 2020-11-25 DIAGNOSIS — Z3A26 26 weeks gestation of pregnancy: Secondary | ICD-10-CM

## 2020-11-25 DIAGNOSIS — Z362 Encounter for other antenatal screening follow-up: Secondary | ICD-10-CM

## 2020-11-25 DIAGNOSIS — Z148 Genetic carrier of other disease: Secondary | ICD-10-CM

## 2020-11-25 DIAGNOSIS — Z8774 Personal history of (corrected) congenital malformations of heart and circulatory system: Secondary | ICD-10-CM

## 2020-11-25 DIAGNOSIS — Z8759 Personal history of other complications of pregnancy, childbirth and the puerperium: Secondary | ICD-10-CM

## 2020-11-25 DIAGNOSIS — Q2730 Arteriovenous malformation, site unspecified: Secondary | ICD-10-CM

## 2020-11-25 DIAGNOSIS — O09292 Supervision of pregnancy with other poor reproductive or obstetric history, second trimester: Secondary | ICD-10-CM | POA: Diagnosis not present

## 2020-12-01 ENCOUNTER — Encounter: Payer: Self-pay | Admitting: Obstetrics and Gynecology

## 2020-12-01 ENCOUNTER — Ambulatory Visit (INDEPENDENT_AMBULATORY_CARE_PROVIDER_SITE_OTHER): Payer: Medicaid Other | Admitting: Obstetrics and Gynecology

## 2020-12-01 ENCOUNTER — Other Ambulatory Visit: Payer: Medicaid Other

## 2020-12-01 ENCOUNTER — Other Ambulatory Visit: Payer: Self-pay

## 2020-12-01 VITALS — BP 114/80 | HR 116 | Wt 170.0 lb

## 2020-12-01 DIAGNOSIS — Q2730 Arteriovenous malformation, site unspecified: Secondary | ICD-10-CM

## 2020-12-01 DIAGNOSIS — O0990 Supervision of high risk pregnancy, unspecified, unspecified trimester: Secondary | ICD-10-CM

## 2020-12-01 NOTE — Progress Notes (Signed)
ROB [redacted]w[redacted]d  2hr GTT   T-dap declines.  CC: pt has concerns about baby's presentation has questions pt advised to consult w/ provider.

## 2020-12-01 NOTE — Progress Notes (Signed)
   PRENATAL VISIT NOTE  Subjective:  Maria Orr is a 25 y.o. G5P0040 at [redacted]w[redacted]d being seen today for ongoing prenatal care.  She is currently monitored for the following issues for this high-risk pregnancy and has AVM (congenital arteriovenous malformation); Supervision of high risk pregnancy, antepartum; History of gonorrhea; Venous vascular malformations; History of ectopic pregnancy; History of cervical incompetence; Pregnancy with poor obstetric history; Carrier of spinal muscular atrophy; SUI (stress urinary incontinence, female); and Frequent urination on their problem list.  Patient reports no complaints.  Contractions: Not present. Vag. Bleeding: None.  Movement: Present. Denies leaking of fluid.   The following portions of the patient's history were reviewed and updated as appropriate: allergies, current medications, past family history, past medical history, past social history, past surgical history and problem list.   Objective:   Vitals:   12/01/20 0840  BP: 114/80  Pulse: (!) 116  Weight: 170 lb (77.1 kg)    Fetal Status: Fetal Heart Rate (bpm): 154   Movement: Present     General:  Alert, oriented and cooperative. Patient is in no acute distress.  Skin: Skin is warm and dry. No rash noted.   Cardiovascular: Normal heart rate noted  Respiratory: Normal respiratory effort, no problems with respiration noted  Abdomen: Soft, gravid, appropriate for gestational age.  Pain/Pressure: Absent     Pelvic: Cervical exam deferred        Extremities: Normal range of motion.  Edema: Trace  Mental Status: Normal mood and affect. Normal behavior. Normal judgment and thought content.   Assessment and Plan:  Pregnancy: G5P0040 at [redacted]w[redacted]d 1. Supervision of high risk pregnancy, antepartum Patient is doing well without complaints Third trimester labs with glucola today Follow up growth ultrasound scheduled Patient researching pediatrician Patient desires progesterone only  contraception  2. AVM (congenital arteriovenous malformation) Follow up MRI 9/9  Preterm labor symptoms and general obstetric precautions including but not limited to vaginal bleeding, contractions, leaking of fluid and fetal movement were reviewed in detail with the patient. Please refer to After Visit Summary for other counseling recommendations.   Return in about 2 weeks (around 12/15/2020) for in person, ROB, Low risk.  Future Appointments  Date Time Provider Department Center  12/01/2020  9:55 AM Meghna Hagmann, Gigi Gin, MD [facility] None  12/24/2020  7:30 AM [facility] NURSE [facility]  12/24/2020  7:45 AM [facility] US5 [facility]    Catalina Antigua, MD

## 2020-12-01 NOTE — Patient Instructions (Signed)
AREA PEDIATRIC/FAMILY PRACTICE PHYSICIANS  Central/[place]outheast [place] ([address]) [facility] Chambliss, MD; Eniola, MD; Hale, MD; Hensel, MD; McDiarmid, MD; McIntyer, MD; Neal, MD; Walden, MD [address] [place], [place] [address] [phone] Mon-Fri 8:30-12:30, 1:30-5:00 Providers come to see babies at [facility] Accepting Medicaid [facility] providers who accept newborns: Koirala, MD; Morrow, MD; Wolters, MD [address], [place], [place] [address] [phone] Mon-Fri 8:00-5:30 Babies seen by providers at [facility] Does NOT accept Medicaid Please call early in hospitalization for appointment (limited availability)  [facility] [place], MD [address] [place], [place] [address] [phone] Mon, Tue, Thur, Fri 8:30-5:00, Wed 10:00-7:00 (closed 1-2pm) Babies seen by [facility] providers Accepting Medicaid Rubin - Pediatrician Rubin, MD [address], [place], [place] [address] [phone] Mon-Fri 8:30-5:00, Sat 8:30-12:00 Provider comes to see babies at [facility] Accepting Medicaid Must have been referred from current patients or contacted office prior to delivery [facility] ([facility]) Brown, MD; Chandler, MD; Ettefagh, MD; Grant, MD; Lester, MD; McCormick, MD; McQueen, MD; Prose, MD; Simha, MD; Stanley, MD; Stryffeler, NP; Tebben, NP [address], [place], [place] [address] [phone] Mon, Tue, Thur, Fri 8:30-5:30, Wed 9:30-5:30, Sat 8:30-12:30 Babies seen by [facility] providers Accepting Medicaid Only accepting infants of first-time parents or siblings of current patients Hospital discharge coordinator will make follow-up appointment Maria Orr [address], [place], [place]  [address] [phone]   Fax - [phone] [facility] [address], [place], [place]  [address] Phone - [phone]   Fax - [phone] Maria Orr [address], [place], [place]  [address] [phone]  East/[place]heast [place] ([address]) [facility] Bates, MD; Brassfield, MD; Cooper, Cox, MD; MD; Davis, MD; Dovico, MD; Ettefaugh, MD; Little, MD; Lowe, MD; Keiffer, MD; Melvin, MD; Sumner, MD; Williams, MD [address], [place], [place] [address] [phone] Mon-Fri 8:30-5:00 (extended evenings Mon-Thur as needed), Sat-Sun 10:00-1:00 Providers come to see babies at [facility] Accepting Medicaid for families of first-time babies and families with all children in the household age 3 and under. Must register with office prior to making appointment (M-F only). [facility] Henson, NP; Knapp, MD; Lalonde, MD; Tysinger, PA [address] [place], [place] [address] [phone] Mon-Fri 8:00-5:00 Babies seen by providers at [facility] Does NOT accept Medicaid/Commercial Insurance Only [facility] ([facility])  Artis, MD; Barnes, MD; Bratton, MD; Coccaro, MD; Lockett Gardner, MD; Kramer, MD; Marshall, MD; Netherton, MD; Poleto, MD; Skinner, MD [address] [place], [place] [address] [phone] Mon-Fri 8:30-5:30, Sat (Oct.-Mar.) 9:00-1:00 Babies seen by providers at [facility] Accepting Medicaid  [place] ([address]) [facility] Reid, MD; Warner, MD [address], [place], [place] [address] [phone] Mon-Fri 8:30-5:00, Sat 8:30-12:00 Providers come to see babies at [facility] Does NOT accept Medicaid [facility] Becker, PA; Hagler, MD; Scifres, PA; Sun, MD; Swayne, MD [address], [place], [place] [address] [phone] Mon-Fri 8:00-5:00 Babies seen by providers at [facility] Does NOT accept Medicaid Only accepting babies of parents who  are patients Please call early in hospitalization for appointment (limited availability) [facility] Clark, MD; Frye, MD; Kelleher, MD; Mack, NP; Miller, MD; O'Keller, MD; Patterson, NP; Pudlo, MD; Puzio, MD; Thomas, MD; Tucker, MD; Twiselton, MD [address], [place], [place] [address] [phone] Mon-Fri 8:00-5:00, Sat 9:00-12:00 Providers come to see babies at [facility] Does NOT accept Medicaid  [place] ([address]) [facility] providers accepting new patients: Brake, NP; Wharton, PA [address], [place], [place] [address] [phone] Mon-Fri 8:00-5:00 Babies seen by providers at [facility] Does NOT accept Medicaid Only accepting babies of parents who are patients Please call early in hospitalization for appointment (limited availability) [facility] Gay, MD; Quinlan, MD [address] [place], [place] [address] [phone] (press 1 to schedule appointment) Mon-Fri 8:00-5:00 Providers come to see babies at [facility] Does NOT accept Medicaid [facility] Mazer, MD [address] [place], [place] [address] [phone] Mon-Fri 8:30-5:00 (lunch 12:30-1:00), extended hours by appointment only Wed 5:00-6:30 Babies seen by [facility] providers Accepting Medicaid [facility] Banks, MD; Jordan, MD; Koberlein, MD [address], [place], [place] [address] [phone] Mon-Fri 8:00-5:00 Babies seen by [facility] providers Does NOT accept Medicaid [facility] Parker, MD; Hunter, MD; Wallace, DO [address] [place], [place] [address] [phone] Mon-Fri 8:00-5:00 Babies seen by [facility] providers Does NOT accept Medicaid [facility] Brandon, PA; Brecken, PA; Christy, NP; Dees, MD; DeClaire, MD; DeWeese, MD; Hansen, NP; Mills, NP; Parrish, NP; Smoot, NP; Summer, MD; Vapne,  MD [address] [place], [place] [address] [phone] Mon-Fri 8:30-5:00, Sat 10:00-1:00 Providers come to see babies at [facility] Does NOT accept Medicaid Free prenatal information session Tuesdays at 4:45pm [facility] Bouska, MD; Gordon, PA; Jeffery, PA; Weber, PA [address] [place] [address] [phone] Mon-Fri 7:30-5:30 Babies seen by [facility] providers [facility] [address], [place], [place]  [address] [phone]   Fax - [phone]  [place] ([address] & [address]) [facility] [place] [address] [place], [place] [address] [phone] Mon-Thur 8:00-6:00 Providers come to see babies at [facility] Accepting Medicaid [facility] Anderson, NP; Badger, MD; Beal, PA; Spencer, PA [address] [place], [place] [address] [phone] Mon-Thur 7:30-7:30, Fri 7:30-4:30 Babies seen by [facility] providers Accepting Medicaid [facility] Agbuya, MD; Klett, NP; Romgoolam, MD [address], [place], [place] [address] [phone] Mon-Fri 8:30-5:00, Sat 8:30-12:00 Providers come to see babies at [facility] Accepting Medicaid Must have "Meet & Greet" appointment at office prior to delivery [facility] ([facility]) McCord, MD; Wallace, MD; Wood, MD [address], [place], [place] [address] [phone] Mon-Wed 8:00-6:00, Thur-Fri 8:00-5:00, Sat 9:00-12:00 Providers come to see babies at [facility] Does NOT accept Medicaid Only accepting siblings of current patients [facility]  [address], [place], [place]  [address] [phone]   Fax - [phone] [facility] [address], [place], [place]  [address] [phone]   Fax -  [phone]  [place] ([address] & [address]) [facility] Cirigliano, DO; Matthews, DO [address] [place], [place] [address] [phone] Mon-Fri 7:00-5:00 Babies seen by [facility] providers Does NOT accept Medicaid [facility] Briscoe, MD; Howley, PA; Moreira, PA [address], [place], [place] [address] [phone] Mon-Fri 8:00-5:00 Babies seen by [facility] providers Accepting Medicaid [facility] Boyd, MD; Church, PA; Jones, NP; Osborn, PA [address], [place], [place] [address] [phone] Mon-Fri 8:00-5:00 Babies seen by providers at [facility] Accepting Medicaid  [place] ([address]) [facility] [place] Wend[place], DO [address] [place], [place] [address] [phone] Mon-Fri 8:00-5:00 Babies seen by [facility] providers Does NOT accept Medicaid Limited availability, please call early in hospitalization to schedule follow-up [facility] Calderon, PA; Cummings, MD; Dillard, MD; Martin, PA; Olson, MD; VanDeven, PA [address], [place], [place] [address] [phone] Mon-Fri 8:30-5:00, Sat 9:00-12:00 Babies seen by providers at [facility] Accepting Medicaid Please register online then schedule online or call office [email or web address] Hunter, NP; Kumar, MD; Martin Rogers, PA [address], [place], [place] [address] [phone] Mon-Fri 8:00-5:00 Babies seen by providers at [facility] Accepting Medicaid [facility] ([facility]) [place], MD; Kristi Fleenor, NP; West, MD [address], [place], [place] [address] [phone] Mon-Fri 8:00-5:30, Sat&Sun by appointment (phones open at  8:30) Babies seen by [facility] providers Accepting Medicaid Must be a first-time baby or sibling of current patient [facility] [place]  [address], [place], [place]  [address] [phone]   Fax - [phone]  [place] ([address]) [facility] Brown, PA; Cowen, PA; Rice, MD; Helton, PA; Spry, MD [address] [place], [place] [address] [phone] Mon-Thur 8:00-7:00, Fri 8:00-5:00, Sat 8:00-12:00, Sun 9:00-12:00 Babies seen by [facility] providers Accepting Medicaid [facility] Coe-Goins, MD; Marshall, MD; Pierre-Louis, MD [address], [place], [place] [address] [phone] Mon-Thur 8:00-5:00 Babies seen by providers at [facility] Accepting Medicaid [facility] Bratton, MD; Coe-Goins, MD; Hayes, MD; Lewis, MD; List, MD; Lott, MD; Marshall, MD; Moran, MD; O'Neal, MD; Pierre-Louis, MD; Pitonzo, MD; Scholer, MD; Spangle, MD [address] [place], [place] [address] [phone] Mon-Fri 8:00-5:30, Sat (Oct.-Mar.) 9:00-1:00 Babies seen by providers at [facility] Accepting Medicaid Must fill out new patient packet, available online at [email or web address] [facility] ([facility]) Friddle, NP; Harris, NP; Kelly, NP; Logan, MD; Melvin, PA; Poth, MD; Ramadoss, MD; Stanton, NP [address], [place], [place] [address] [phone] Mon-Thur 8:00-5:30, Fri 8:00-5:00 Babies seen by providers at [facility] Accepting Medicaid  [place] ([address]) [facility] Dixon, PA; Bicknell, MD; Pickard, MD; Tapia, PA [address], [place], [place] [address] [phone] Mon-Fri 8:00-5:00 Babies seen by providers at [facility] Accepting Medicaid   [place] ([address]) [facility] [place] Masneri, DO; Meyers, MD; Nelson, PA [address], [place], [place] [address] [phone] Mon-Fri 8:00-5:00 Babies seen by providers at [facility] Does NOT accept Medicaid Limited appointment availability, please call early in hospitalization  [facility] [place] Kunedd, DO; McGowen, MD [address], [place], [place] [address] [phone] Mon-Fri 8:00-5:00 Babies seen by [facility] providers Does NOT accept Medicaid [facility] [place] Cameron, MD; MacDonald, MD; Michaels, PA; Nayak, MD [address] [place] [address], [place], [place] [address] [phone] Mon-Fri 8:00-5:00 After hours clinic ([address] [place], [place] [address]) [phone] Mon-Fri 5:00-8:00, Sat 12:00-6:00, Sun 10:00-4:00 Babies seen by [facility] providers Accepting Medicaid [facility] [place] [address], [place], [place]  [address] [phone]   Fax - [phone]  [place] ([address]) [facility] [place] [facility] Andy, MD [address], [place], [place] [address] [phone] Mon-Fri 8:00-5:00 Babies seen by [facility] providers Does NOT accept Medicaid [facility] [place] ([facility] [place]) Eksir, MD [address], [place], [place] [address] [phone] Mon-Thur 8:00-7:00, Fri 8:00-5:00, Sat 8:00-12:00 Babies seen by providers at [facility] Accepting Medicaid - but does not have vaccinations in office (must be received elsewhere) Limited availability, please call early in hospitalization  [place] ([address]) [facility]  Maria Flemming, MD [address], [place] [address] [phone]  Fax [phone]  [place] [facility]  Maria Newton, MD, Maria Streilein, PA, Maria Hampton, PA [address] [place] [place], [place]  [address] [phone] [facility]  [address], [place], [place] [address] [phone] [address], [place], [place] [address] [phone] (West Office)  [facility] [address], [place], [place] [address] [phone] [facility] [address] [place], [place], [place] [address] [phone] [facility] [address], [place], [place] [address] [phone] [facility] [address], [place], [place] [address] [phone] [facility] [address], [place], [place] [address] [phone] [facility] [address], [place], [place] [address] [phone] [facility]  [address], [place], [place] [address] [phone] Dr[facility] [address] [place] [address], [place], [place] [address] [phone] [facility] [address], [place], [place] [address] [phone] [facility] [address], [place], [place] [address] [phone]  

## 2020-12-02 LAB — CBC
Hematocrit: 36.3 % (ref 34.0–46.6)
Hemoglobin: 12.1 g/dL (ref 11.1–15.9)
MCH: 29.6 pg (ref 26.6–33.0)
MCHC: 33.3 g/dL (ref 31.5–35.7)
MCV: 89 fL (ref 79–97)
Platelets: 298 10*3/uL (ref 150–450)
RBC: 4.09 x10E6/uL (ref 3.77–5.28)
RDW: 12.7 % (ref 11.7–15.4)
WBC: 11.4 10*3/uL — ABNORMAL HIGH (ref 3.4–10.8)

## 2020-12-02 LAB — GLUCOSE TOLERANCE, 2 HOURS W/ 1HR
Glucose, 1 hour: 136 mg/dL (ref 65–179)
Glucose, 2 hour: 111 mg/dL (ref 65–152)
Glucose, Fasting: 83 mg/dL (ref 65–91)

## 2020-12-02 LAB — RPR: RPR Ser Ql: NONREACTIVE

## 2020-12-02 LAB — HIV ANTIBODY (ROUTINE TESTING W REFLEX): HIV Screen 4th Generation wRfx: NONREACTIVE

## 2020-12-14 ENCOUNTER — Inpatient Hospital Stay (HOSPITAL_COMMUNITY)
Admission: AD | Admit: 2020-12-14 | Discharge: 2020-12-14 | Disposition: A | Discharge status: Home / Self Care | Payer: Medicaid Other | Attending: Obstetrics and Gynecology | Admitting: Obstetrics and Gynecology

## 2020-12-14 ENCOUNTER — Encounter (HOSPITAL_COMMUNITY): Payer: Self-pay | Admitting: Obstetrics and Gynecology

## 2020-12-14 ENCOUNTER — Other Ambulatory Visit: Payer: Self-pay

## 2020-12-14 DIAGNOSIS — Z3689 Encounter for other specified antenatal screening: Secondary | ICD-10-CM

## 2020-12-14 DIAGNOSIS — R102 Pelvic and perineal pain: Secondary | ICD-10-CM | POA: Diagnosis not present

## 2020-12-14 DIAGNOSIS — Z3A29 29 weeks gestation of pregnancy: Secondary | ICD-10-CM | POA: Diagnosis not present

## 2020-12-14 DIAGNOSIS — O26893 Other specified pregnancy related conditions, third trimester: Secondary | ICD-10-CM | POA: Diagnosis not present

## 2020-12-14 LAB — URINALYSIS, ROUTINE W REFLEX MICROSCOPIC
Bilirubin Urine: NEGATIVE
Glucose, UA: NEGATIVE mg/dL
Ketones, ur: NEGATIVE mg/dL
Nitrite: NEGATIVE
Protein, ur: NEGATIVE mg/dL
Specific Gravity, Urine: 1.01 (ref 1.005–1.030)
pH: 6.5 (ref 5.0–8.0)

## 2020-12-14 LAB — WET PREP, GENITAL
Clue Cells Wet Prep HPF POC: NONE SEEN
Sperm: NONE SEEN
Trich, Wet Prep: NONE SEEN
Yeast Wet Prep HPF POC: NONE SEEN

## 2020-12-14 LAB — URINALYSIS, MICROSCOPIC (REFLEX)

## 2020-12-14 NOTE — MAU Provider Note (Signed)
History     CSN: [id number]  Arrival date and time: 12/14/20 1213   Event Date/Time   First Provider Initiated Contact with Patient 12/14/20 1250      Chief Complaint  Patient presents with   Abdominal Pain   Pelvic Pain   25 y.o. [id number] _0 .0 wks presenting by EMS with pelvic pain and ctx. Reports abrupt onset of sharp pelvic pain while at work. She sat down to help the pain but got no relief. She also tried walking around but that didn't help. After EMS arrived she thinks she was having ctx. Denies VB or LOF. Reports good FM. No urinary sx. Pain has improved since arrival. Rates 3/10.    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    Family History  Problem Relation Age of Onset   Neuropathy Mother    Cancer Brother     Social History   Tobacco Use   Smoking status: Never   Smokeless tobacco: Never  Vaping Use   Vaping Use: Never used  Substance Use Topics   Alcohol use: No   Drug use: No    Allergies: No Known Allergies  Medications Prior to Admission  Medication Sig Dispense Refill Last Dose   Prenatal Vit-Fe Fumarate-FA (PREPLUS) 27-1 MG TABS Take 1 tablet by mouth daily. 30 tablet 13 12/14/2020   Blood Pressure Monitoring (BLOOD PRESSURE KIT) DEVI Monitor Blood Pressure Readings at Home Regularly 1 each 0    Calcium Carbonate-Vitamin D (CALCIUM-VITAMIN D3 PO) Take 1 tablet by mouth daily. (Patient not taking: No sig reported)      metroNIDAZOLE (FLAGYL) 500 MG tablet Take 1 tablet (500 mg total) by mouth 2 (two) times daily. (Patient not taking:  Reported on 10/11/2020) 14 tablet 0    Misc. Devices ([place]) MISC Monitor weight at home regularly (Patient not taking: No sig reported) 1 each 0    Prenatal Vit-Fe Fumarate-FA (PRENATAL COMPLETE) 14-0.4 MG TABS Take 1 tablet by mouth daily. (Patient not taking: Reported on 09/03/2020)      Prenatal Vit-Fe Fumarate-FA (PRENATAL VITAMINS PLUS) 27-1 MG TABS Take 1 tablet by mouth daily. (Patient not taking: No sig reported) 30 tablet 11    promethazine (PHENERGAN) 25 MG tablet Take 0.5-1 tablets (12.5-25 mg total) by mouth every 6 (six) hours as needed. (Patient not taking: Reported on 10/11/2020) 30 tablet 0     Review of Systems  Gastrointestinal:  Negative for abdominal pain.  Genitourinary:  Positive for pelvic pain. Negative for dysuria, hematuria, urgency, vaginal bleeding and vaginal discharge.  Physical Exam   Blood pressure 118/74, pulse 98, temperature (!) 97.5 F (36.4 C), temperature source Oral, resp. rate 20, last menstrual period 05/25/2020, SpO2 100 %, unknown if currently  breastfeeding.  Physical Exam Vitals and nursing note reviewed.  Constitutional:      General: She is not in acute distress.    Appearance: Normal appearance.  HENT:     Head: Normocephalic and atraumatic.  Cardiovascular:     Rate and Rhythm: Normal rate.  Pulmonary:     Effort: Pulmonary effort is normal. No respiratory distress.  Abdominal:     Palpations: Abdomen is soft.     Tenderness: There is abdominal tenderness in the suprapubic area.  Genitourinary:    Comments: VE: closed/thick Musculoskeletal:        General: Normal range of motion.     Cervical back: Normal range of motion.  Skin:    General: Skin is warm and dry.  Neurological:     General: No focal deficit present.     Mental Status: She is alert and oriented to person, place, and time.  Psychiatric:        Mood and Affect: Mood is anxious.  EFM: 145 bpm, mod variability, + accels, no decels Toco: none  Results for  orders placed or performed during the hospital encounter of 12/14/20 (from the past 24 hour(s))  Urinalysis, Routine w reflex microscopic Urine, Clean Catch     Status: Abnormal   Collection Time: 12/14/20 12:36 PM  Result Value Ref Range   Color, Urine YELLOW YELLOW   APPearance CLEAR CLEAR   Specific Gravity, Urine 1.010 1.005 - 1.030   pH 6.5 5.0 - 8.0   Glucose, UA NEGATIVE NEGATIVE mg/dL   Hgb urine dipstick TRACE (A) NEGATIVE   Bilirubin Urine NEGATIVE NEGATIVE   Ketones, ur NEGATIVE NEGATIVE mg/dL   Protein, ur NEGATIVE NEGATIVE mg/dL   Nitrite NEGATIVE NEGATIVE   Leukocytes,Ua SMALL (A) NEGATIVE  Urinalysis, Microscopic (reflex)     Status: Abnormal   Collection Time: 12/14/20 12:36 PM  Result Value Ref Range   RBC / HPF 0-5 0 - 5 RBC/hpf   WBC, UA 0-5 0 - 5 WBC/hpf   Bacteria, UA FEW (A) NONE SEEN   Squamous Epithelial / LPF 0-5 0 - 5  Wet prep, genital     Status: Abnormal   Collection Time: 12/14/20  1:08 PM   Specimen: Vaginal  Result Value Ref Range   Yeast Wet Prep HPF POC NONE SEEN NONE SEEN   Trich, Wet Prep NONE SEEN NONE SEEN   Clue Cells Wet Prep HPF POC NONE SEEN NONE SEEN   WBC, Wet Prep HPF POC MANY (A) NONE SEEN   Sperm NONE SEEN    MAU Course  Procedures  MDM Labs ordered and reviewed. No signs of PTL. Pt reassured. Recommend maternity support belt. Stable for discharge home.  Assessment and Plan   1. [redacted] weeks gestation of pregnancy   2. NST (non-stress test) reactive   3. Pelvic pain affecting pregnancy in third trimester, antepartum    Discharge home Follow up at [facility] tomorrow  PTL precautions  Allergies as of 12/14/2020   No Known Allergies      Medication List     STOP taking these medications    CALCIUM-VITAMIN D3 PO   [place] Weight Scale Misc   metroNIDAZOLE 500 MG tablet Commonly known as: FLAGYL   promethazine 25 MG tablet Commonly known as: PHENERGAN       TAKE these medications    Blood Pressure Kit  Devi Monitor Blood Pressure Readings at Home Regularly   PrePLUS 27-1 MG Tabs Take 1 tablet by mouth daily. What  changed: Another medication with the same name was removed. Continue taking this medication, and follow the directions you see here.        Julianne Handler, CNM 12/14/2020, 1:07 PM

## 2020-12-14 NOTE — MAU Note (Signed)
...  Maria Orr is a 25 y.o. at [redacted]w[redacted]d here in MAU via EMS transport reporting: at 1150 she stood up and had severe pelvic pain. She states she thought the way she was sitting was causing the pain and she states she thought she needed to walk it off. She states soon after she felt a sharper pain in the middle of her pelvis, more superficial to her skin. She states it felt like "he was trying to come out through my stomach." When asked if the baby was moving vigorously at that time the patient states, "You know what, now that I think about it he was. He was moving a lot." She states the pain has decreased since then and rates it a 3/10 verses 5/10 at the daycare and on the EMS truck. +FM. No VB or LOF.   Last BM last night.  Hx of incompetent cervix in her first pregnancy and lost the baby at 17w. Hx of two SAB's and one ectopic.   Appointment at [facility] tomorrow. 12/24/2020 has an appointment with MFM.   FHT: 150 initial external Lab orders placed from triage:  UA

## 2020-12-15 ENCOUNTER — Ambulatory Visit (INDEPENDENT_AMBULATORY_CARE_PROVIDER_SITE_OTHER): Payer: Medicaid Other | Admitting: Nurse Practitioner

## 2020-12-15 VITALS — BP 113/77 | HR 96 | Wt 171.0 lb

## 2020-12-15 DIAGNOSIS — O26893 Other specified pregnancy related conditions, third trimester: Secondary | ICD-10-CM

## 2020-12-15 DIAGNOSIS — R102 Pelvic and perineal pain: Secondary | ICD-10-CM

## 2020-12-15 DIAGNOSIS — O0990 Supervision of high risk pregnancy, unspecified, unspecified trimester: Secondary | ICD-10-CM

## 2020-12-15 DIAGNOSIS — Z3A29 29 weeks gestation of pregnancy: Secondary | ICD-10-CM

## 2020-12-15 LAB — GC/CHLAMYDIA PROBE AMP (~~LOC~~) NOT AT ARMC
Chlamydia: NEGATIVE
Comment: NEGATIVE
Comment: NORMAL
Neisseria Gonorrhea: NEGATIVE

## 2020-12-15 NOTE — Progress Notes (Signed)
ROB [redacted]w[redacted]d  2hr GTT WNL   MAU visit 12/14/20 for pain and contractions. NST was reactive. Pt notes feeling fine today.  CC: concerned about baby's position if baby is too low? Also needs to discuss caffeine intake.

## 2020-12-15 NOTE — Progress Notes (Signed)
    Subjective:  Maria Orr is a 25 y.o. G5P0040 at [redacted]w[redacted]d being seen today for ongoing prenatal care.  She is currently monitored for the following issues for this low-risk pregnancy and has AVM (congenital arteriovenous malformation); Supervision of high risk pregnancy, antepartum; History of gonorrhea; Venous vascular malformations; History of ectopic pregnancy; History of cervical incompetence; Pregnancy with poor obstetric history; Carrier of spinal muscular atrophy; SUI (stress urinary incontinence, female); and Frequent urination on their problem list.  Patient reports  pelvic pain .  Contractions: Not present. Vag. Bleeding: None.  Movement: Present. Denies leaking of fluid.   The following portions of the patient's history were reviewed and updated as appropriate: allergies, current medications, past family history, past medical history, past social history, past surgical history and problem list. Problem list updated.  Objective:   Vitals:   12/15/20 0843  BP: 113/77  Pulse: 96  Weight: 171 lb (77.6 kg)    Fetal Status: Fetal Heart Rate (bpm): 148 Fundal Height: 30 cm Movement: Present     General:  Alert, oriented and cooperative. Patient is in no acute distress.  Skin: Skin is warm and dry. No rash noted.   Cardiovascular: Normal heart rate noted  Respiratory: Normal respiratory effort, no problems with respiration noted  Abdomen: Soft, gravid, appropriate for gestational age. Pain/Pressure: Absent     Pelvic:  Cervical exam deferred        Extremities: Normal range of motion.  Edema: None  Mental Status: Normal mood and affect. Normal behavior. Normal judgment and thought content.   Urinalysis:      Assessment and Plan:  Pregnancy: G5P0040 at [redacted]w[redacted]d  1. Supervision of high risk pregnancy, antepartum Seen in MAU this week - doing well, cervix was closed Discussed childbirth and breastfeeding classes Has called a pediatric office and is waiting on a call back from  them for confirmation that she is able to take her baby there.  2. Pelvic pain affecting pregnancy in third trimester, antepartum Has pubic symphysis tenderness with pelvic pain and back pain Reviewed  - Ambulatory referral to Physical Therapy  3. [redacted] weeks gestation of pregnancy  Preterm labor symptoms and general obstetric precautions including but not limited to vaginal bleeding, contractions, leaking of fluid and fetal movement were reviewed in detail with the patient. Please refer to After Visit Summary for other counseling recommendations.  Return in about 2 weeks (around 12/29/2020) for in person ROB.  Nolene Bernheim, RN, MSN, NP-BC Nurse Practitioner, [facility] for [employer], [facility] 12/15/2020 9:20 AM

## 2020-12-15 NOTE — Patient Instructions (Signed)
[email or web address] to sign up for childbirth and breastfeeding classes

## 2020-12-22 ENCOUNTER — Other Ambulatory Visit: Payer: Self-pay

## 2020-12-22 ENCOUNTER — Telehealth: Payer: Self-pay

## 2020-12-22 ENCOUNTER — Ambulatory Visit (INDEPENDENT_AMBULATORY_CARE_PROVIDER_SITE_OTHER): Payer: Medicaid Other | Admitting: Obstetrics & Gynecology

## 2020-12-22 ENCOUNTER — Encounter: Payer: Self-pay | Admitting: Obstetrics & Gynecology

## 2020-12-22 VITALS — BP 121/76 | HR 103 | Wt 176.0 lb

## 2020-12-22 DIAGNOSIS — O0990 Supervision of high risk pregnancy, unspecified, unspecified trimester: Secondary | ICD-10-CM

## 2020-12-22 DIAGNOSIS — O368190 Decreased fetal movements, unspecified trimester, not applicable or unspecified: Secondary | ICD-10-CM

## 2020-12-22 DIAGNOSIS — Z8742 Personal history of other diseases of the female genital tract: Secondary | ICD-10-CM

## 2020-12-22 NOTE — Progress Notes (Signed)
   PRENATAL VISIT NOTE  Subjective:  Maria Orr is a 25 y.o. G5P0040 at [redacted]w[redacted]d being seen today for ongoing prenatal care.  She is currently monitored for the following issues for this high-risk pregnancy and has AVM (congenital arteriovenous malformation); Supervision of high risk pregnancy, antepartum; Venous vascular malformations; History of ectopic pregnancy; History of cervical incompetence; Pregnancy with poor obstetric history; Carrier of spinal muscular atrophy; SUI (stress urinary incontinence, female); and Frequent urination on their problem list.  Patient reports no complaints.  Contractions: Not present. Vag. Bleeding: None.  Movement: (!) Decreased. Denies leaking of fluid.   The following portions of the patient's history were reviewed and updated as appropriate: allergies, current medications, past family history, past medical history, past social history, past surgical history and problem list.   Objective:   Vitals:   12/22/20 0947  BP: 121/76  Pulse: (!) 103  Weight: 176 lb (79.8 kg)    Fetal Status:     Movement: (!) Decreased     General:  Alert, oriented and cooperative. Patient is in no acute distress.  Skin: Skin is warm and dry. No rash noted.   Cardiovascular: Normal heart rate noted  Respiratory: Normal respiratory effort, no problems with respiration noted  Abdomen: Soft, gravid, appropriate for gestational age.  Pain/Pressure: Absent     Pelvic: Cervical exam deferred        Extremities: Normal range of motion.  Edema: Trace  Mental Status: Normal mood and affect. Normal behavior. Normal judgment and thought content.   Assessment and Plan:  Pregnancy: G5P0040 at [redacted]w[redacted]d 1. Decreased fetal movement during pregnancy, antepartum, single or unspecified fetus Reactive NST - Fetal nonstress test  2. Supervision of high risk pregnancy, antepartum Follow growth [place]   3. History of cervical incompetence Doing well  Preterm labor symptoms and general  obstetric precautions including but not limited to vaginal bleeding, contractions, leaking of fluid and fetal movement were reviewed in detail with the patient. Please refer to After Visit Summary for other counseling recommendations.   Return in about 6 days (around 12/28/2020).  Future Appointments  Date Time Provider Department Center  12/24/2020  7:30 AM [facility] NURSE [facility]  12/24/2020  7:45 AM [facility] US5 [facility]  12/29/2020  9:55 AM Left[place]-Kirby, Wilmer Floor, CNM [facility] None    Scheryl Darter, MD

## 2020-12-22 NOTE — Telephone Encounter (Signed)
Pt called c/o decreased fetal movement. States she has not been feeling baby move for the past few days. States she had mentioned that she had noticed the baby was not moving as much at her last OV to the provider. Pt states she did lay down after meal to monitor movement but did not get 5-10 movements within the hour. Per protocol will bring patient in for NST. Advised patient to eat breakfast and drink something with sugar and/or caffeine to see if that will help to increase baby's movements. Pt agreed and verbalized understanding Appointment scheduled.

## 2020-12-22 NOTE — Progress Notes (Signed)
Work in [employer] [redacted]w[redacted]d for Decreased FM   NST today.   Pt states decreased FM since Saturday.

## 2020-12-23 IMAGING — US US OB < 14 WEEKS - US OB TV
1 series · 15 of 28 positions shown · non-contrast
Comparison: None.

CLINICAL DATA: Initial evaluation for acute pelvic pain, vaginal
spotting. Early pregnancy.

EXAM:
OBSTETRIC <14 WK US AND TRANSVAGINAL OB US
TECHNIQUE: Both transabdominal and transvaginal ultrasound examinations were
performed for complete evaluation of the gestation as well as the
maternal uterus, adnexal regions, and pelvic cul-de-sac.
Transvaginal technique was performed to assess early pregnancy.

[Series 1: us ob < 14 weeks - us ob tv · 15 of 57 slices shown]
[im 1/57]
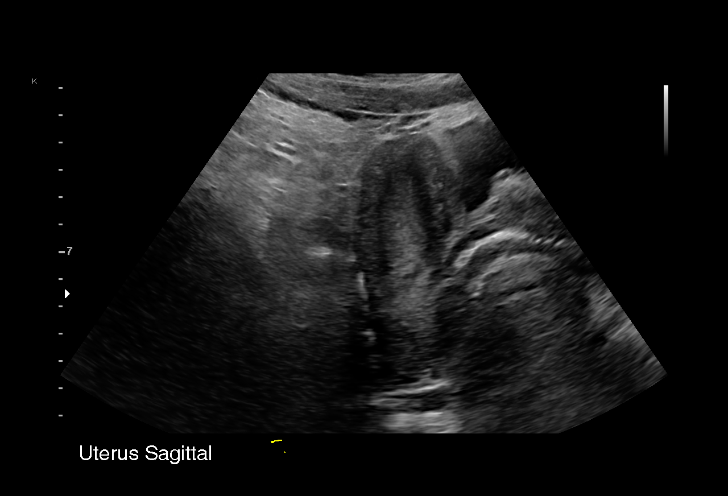
[im 5/57]
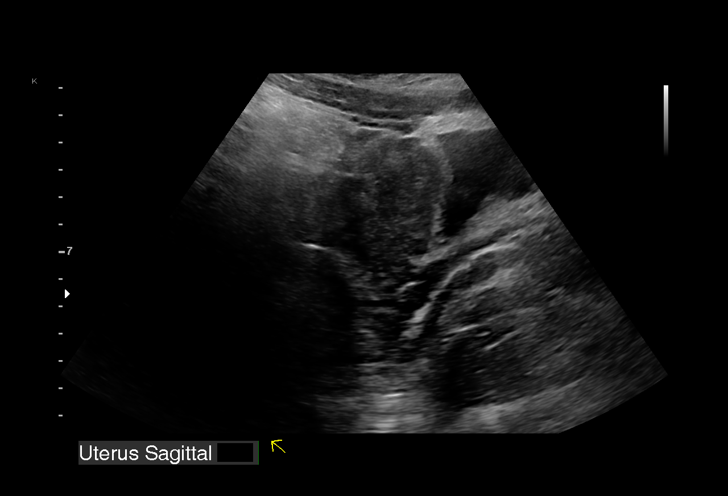
[im 9/57]
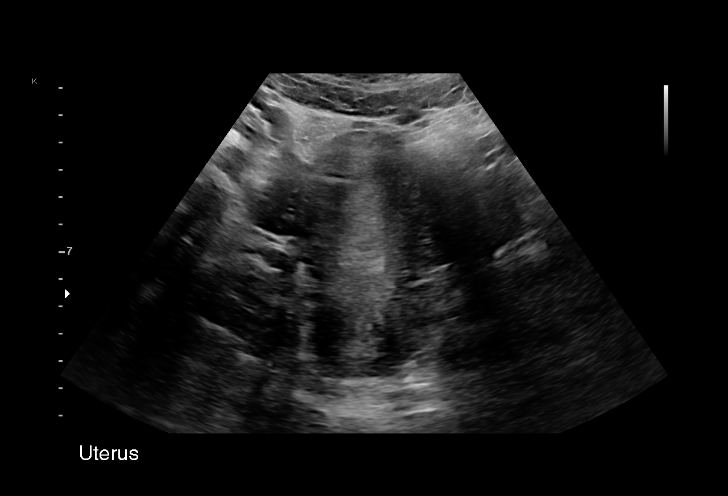
[im 13/57]
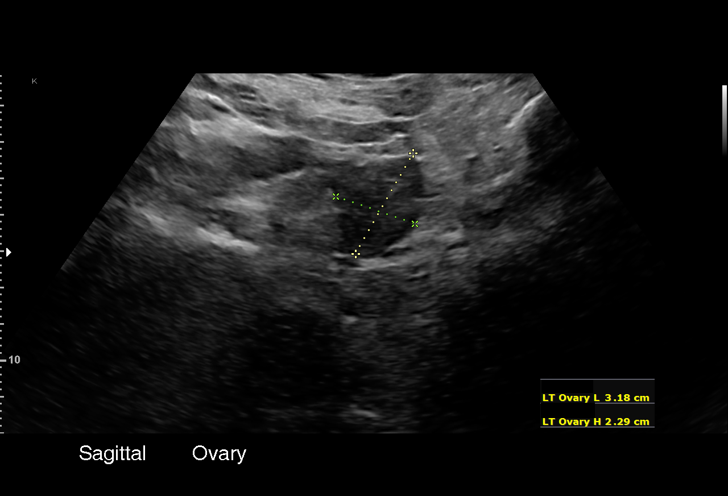
[im 17/57]
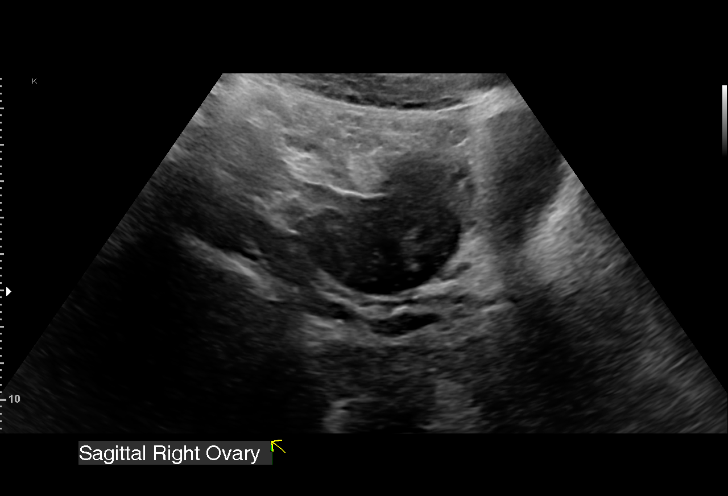
[im 21/57]
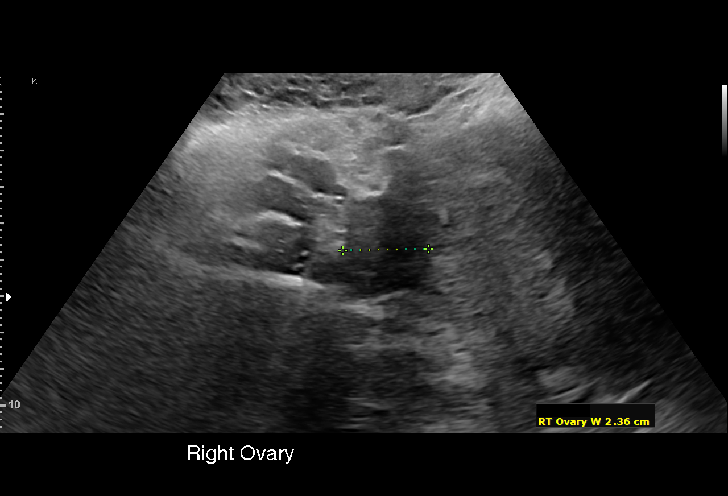
[im 25/57]
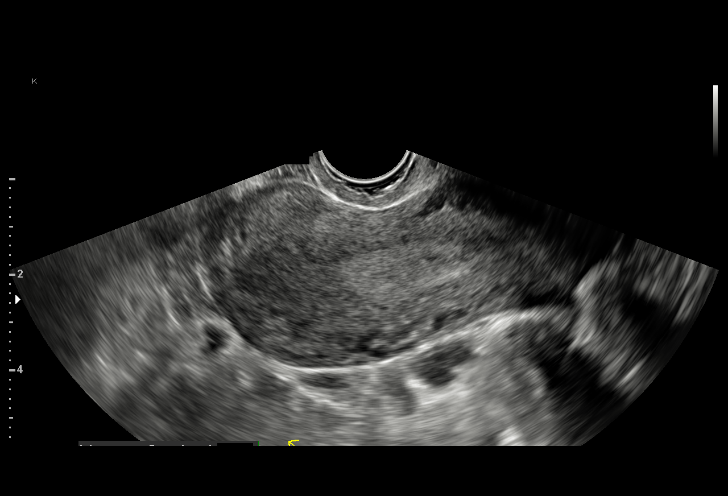
[im 30/57]
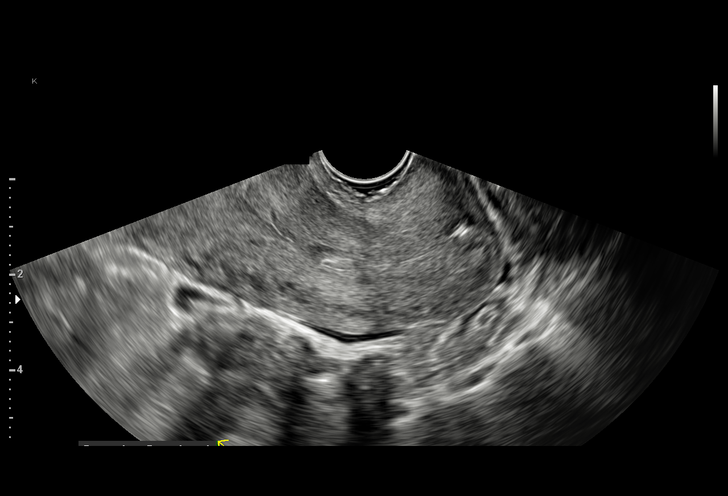
[im 32/57]
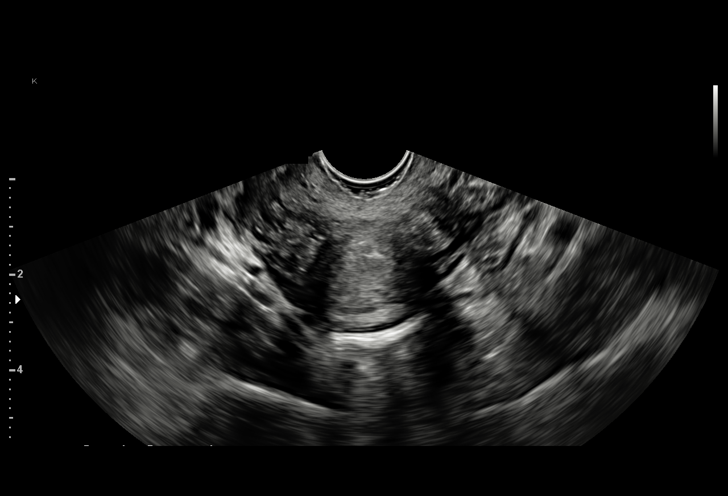
[im 36/57]
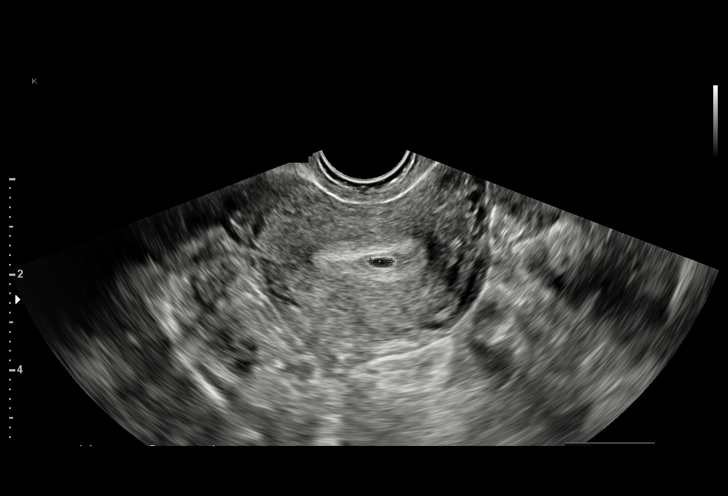
[im 40/57]
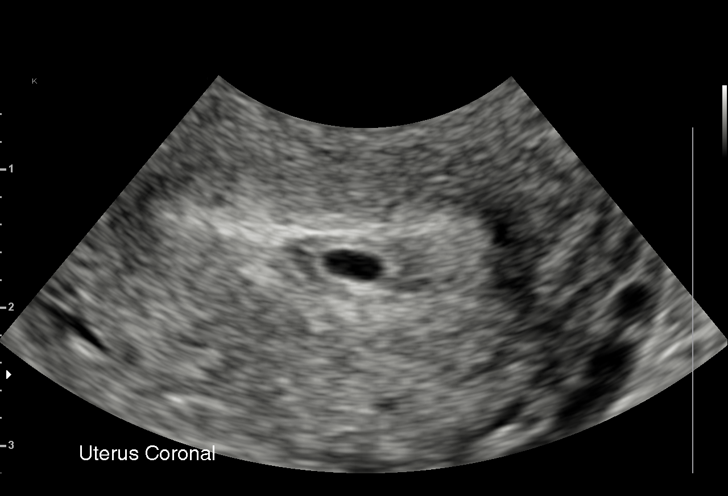
[im 44/57]
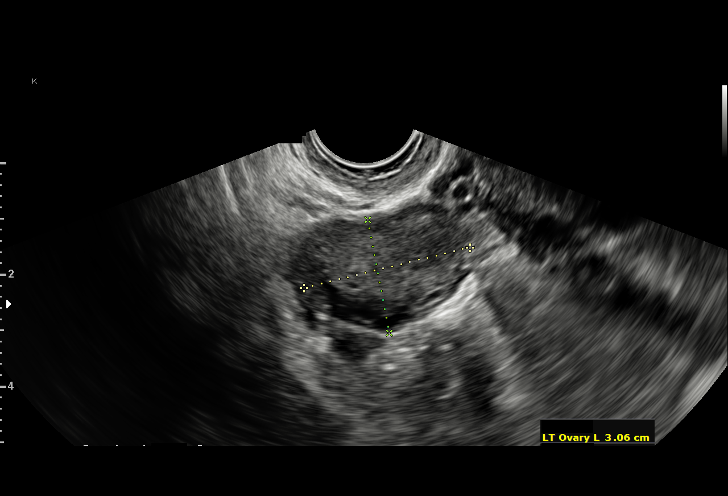
[im 48/57]
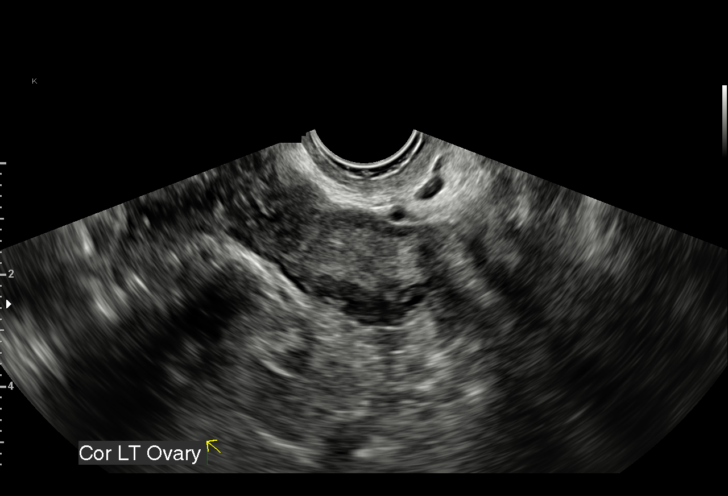
[im 52/57]
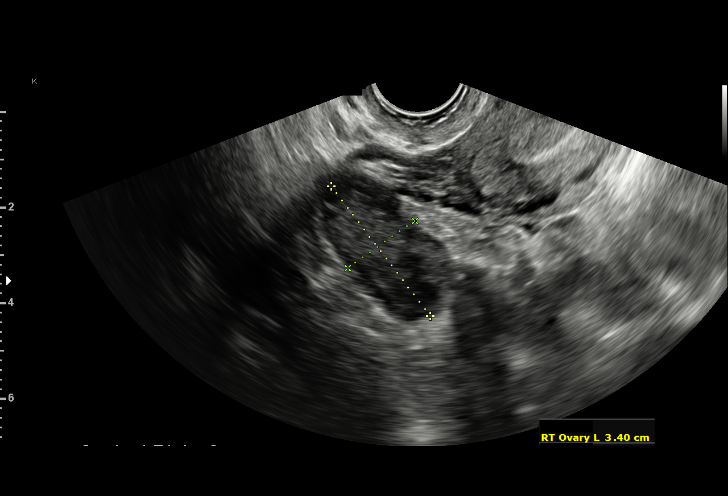
[im 57/57]
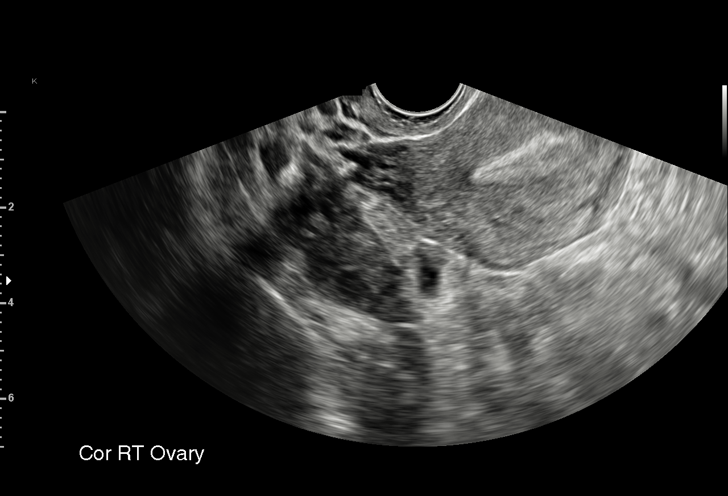

[15 of 28 positions shown; findings below may reference images not displayed]

FINDINGS: Intrauterine gestational sac: Probable early intrauterine
gestational sac.

Yolk sac:  Not visualized.

Embryo:  Not visualized.

Cardiac Activity: Not visualized.

Heart Rate: N/A  bpm

MSD: 3.92 mm   5 w   0 d

Subchorionic hemorrhage:  None visualized.

Maternal uterus/adnexae: Ovaries are normal in appearance
bilaterally. No adnexal mass or free fluid.
IMPRESSION: 1. Probable early intrauterine gestational sac, but no yolk sac,
fetal pole, or cardiac activity yet visualized. Recommend follow-up
quantitative B-HCG levels and follow-up US in 14 days to confirm and
assess viability. This recommendation follows SRU consensus
guidelines: Diagnostic Criteria for Nonviable Pregnancy Early in the
First Trimester. N Engl J Med 1552; [DATE].
2. No other acute maternal uterine or adnexal abnormality
identified.

## 2020-12-24 ENCOUNTER — Encounter: Payer: Self-pay | Admitting: *Deleted

## 2020-12-24 ENCOUNTER — Other Ambulatory Visit: Payer: Self-pay | Admitting: *Deleted

## 2020-12-24 ENCOUNTER — Other Ambulatory Visit: Payer: Self-pay

## 2020-12-24 ENCOUNTER — Ambulatory Visit: Payer: Medicaid Other | Attending: Obstetrics

## 2020-12-24 ENCOUNTER — Ambulatory Visit: Payer: Medicaid Other | Admitting: *Deleted

## 2020-12-24 VITALS — BP 128/63 | HR 106

## 2020-12-24 DIAGNOSIS — O3430 Maternal care for cervical incompetence, unspecified trimester: Secondary | ICD-10-CM | POA: Insufficient documentation

## 2020-12-24 DIAGNOSIS — Z8759 Personal history of other complications of pregnancy, childbirth and the puerperium: Secondary | ICD-10-CM | POA: Insufficient documentation

## 2020-12-24 DIAGNOSIS — Z362 Encounter for other antenatal screening follow-up: Secondary | ICD-10-CM

## 2020-12-24 DIAGNOSIS — O09293 Supervision of pregnancy with other poor reproductive or obstetric history, third trimester: Secondary | ICD-10-CM

## 2020-12-24 DIAGNOSIS — Q2730 Arteriovenous malformation, site unspecified: Secondary | ICD-10-CM | POA: Diagnosis present

## 2020-12-24 DIAGNOSIS — Z3A30 30 weeks gestation of pregnancy: Secondary | ICD-10-CM

## 2020-12-24 DIAGNOSIS — O365931 Maternal care for other known or suspected poor fetal growth, third trimester, fetus 1: Secondary | ICD-10-CM

## 2020-12-29 ENCOUNTER — Encounter: Payer: Medicaid Other | Admitting: Advanced Practice Midwife

## 2020-12-29 NOTE — Progress Notes (Deleted)
   PRENATAL VISIT NOTE  Subjective:  Maria Orr is a 25 y.o. G5P0040 at 103w1d being seen today for ongoing prenatal care.  She is currently monitored for the following issues for this {Blank single:19197::"high-risk","low-risk"} pregnancy and has AVM (congenital arteriovenous malformation); Supervision of high risk pregnancy, antepartum; Venous vascular malformations; History of ectopic pregnancy; History of cervical incompetence; Pregnancy with poor obstetric history; Carrier of spinal muscular atrophy; SUI (stress urinary incontinence, female); and Frequent urination on their problem list.  Patient reports {sx:14538}.   .  .   . Denies leaking of fluid.   The following portions of the patient's history were reviewed and updated as appropriate: allergies, current medications, past family history, past medical history, past social history, past surgical history and problem list.   Objective:  There were no vitals filed for this visit.  Fetal Status:           General:  Alert, oriented and cooperative. Patient is in no acute distress.  Skin: Skin is warm and dry. No rash noted.   Cardiovascular: Normal heart rate noted  Respiratory: Normal respiratory effort, no problems with respiration noted  Abdomen: Soft, gravid, appropriate for gestational age.        Pelvic: {Blank single:19197::"Cervical exam performed in the presence of a chaperone","Cervical exam deferred"}        Extremities: Normal range of motion.     Mental Status: Normal mood and affect. Normal behavior. Normal judgment and thought content.   Assessment and Plan:  Pregnancy: G5P0040 at [redacted]w[redacted]d 1. Supervision of high risk pregnancy, antepartum ***  2. AVM (congenital arteriovenous malformation) ***  3. Pregnancy with poor obstetric history ***  4. [redacted] weeks gestation of pregnancy ***  {Blank single:19197::"Term","Preterm"} labor symptoms and general obstetric precautions including but not limited to vaginal  bleeding, contractions, leaking of fluid and fetal movement were reviewed in detail with the patient. Please refer to After Visit Summary for other counseling recommendations.   No follow-ups on file.  Future Appointments  Date Time Provider Department Center  12/29/2020  9:55 AM Hurshel Party, CNM [facility] None  01/21/2021  9:30 AM [facility] NURSE [facility]  01/21/2021  9:45 AM [facility] US5 [facility]    Sharen Counter, CNM

## 2020-12-31 ENCOUNTER — Telehealth: Payer: Self-pay | Admitting: Advanced Practice Midwife

## 2021-01-12 ENCOUNTER — Encounter: Payer: Self-pay | Admitting: Obstetrics & Gynecology

## 2021-01-13 ENCOUNTER — Encounter: Payer: Medicaid Other | Admitting: Obstetrics

## 2021-01-14 ENCOUNTER — Other Ambulatory Visit: Payer: Self-pay

## 2021-01-14 ENCOUNTER — Encounter: Payer: Self-pay | Admitting: Obstetrics and Gynecology

## 2021-01-14 ENCOUNTER — Ambulatory Visit (INDEPENDENT_AMBULATORY_CARE_PROVIDER_SITE_OTHER): Payer: Medicaid Other | Admitting: Obstetrics and Gynecology

## 2021-01-14 VITALS — BP 132/82 | HR 111 | Wt 180.0 lb

## 2021-01-14 DIAGNOSIS — O0990 Supervision of high risk pregnancy, unspecified, unspecified trimester: Secondary | ICD-10-CM

## 2021-01-14 DIAGNOSIS — O09299 Supervision of pregnancy with other poor reproductive or obstetric history, unspecified trimester: Secondary | ICD-10-CM | POA: Insufficient documentation

## 2021-01-14 DIAGNOSIS — Z148 Genetic carrier of other disease: Secondary | ICD-10-CM

## 2021-01-14 DIAGNOSIS — Q2730 Arteriovenous malformation, site unspecified: Secondary | ICD-10-CM

## 2021-01-14 DIAGNOSIS — O09293 Supervision of pregnancy with other poor reproductive or obstetric history, third trimester: Secondary | ICD-10-CM

## 2021-01-14 NOTE — Progress Notes (Signed)
Subjective:  Maria Orr is a 25 y.o. G5P0040 at 106w3d being seen today for ongoing prenatal care.  She is currently monitored for the following issues for this high-risk pregnancy and has AVM (congenital arteriovenous malformation); Supervision of high risk pregnancy, antepartum; Venous vascular malformations; History of ectopic pregnancy; History of cervical incompetence; Pregnancy with poor obstetric history; Carrier of spinal muscular atrophy; SUI (stress urinary incontinence, female); and History of preterm premature rupture of membranes (PROM) in previous pregnancy, currently pregnant on their problem list.  Patient reports general discomforts of pregnancy.  Contractions: Not present. Vag. Bleeding: None.  Movement: Present. Denies leaking of fluid.   The following portions of the patient's history were reviewed and updated as appropriate: allergies, current medications, past family history, past medical history, past social history, past surgical history and problem list. Problem list updated.  Objective:   Vitals:   01/14/21 1424  BP: 132/82  Pulse: (!) 111  Weight: 180 lb (81.6 kg)    Fetal Status: Fetal Heart Rate (bpm): 151   Movement: Present     General:  Alert, oriented and cooperative. Patient is in no acute distress.  Skin: Skin is warm and dry. No rash noted.   Cardiovascular: Normal heart rate noted  Respiratory: Normal respiratory effort, no problems with respiration noted  Abdomen: Soft, gravid, appropriate for gestational age. Pain/Pressure: Absent     Pelvic:  Cervical exam deferred        Extremities: Normal range of motion.  Edema: Mild pitting, slight indentation  Mental Status: Normal mood and affect. Normal behavior. Normal judgment and thought content.   Urinalysis:      Assessment and Plan:  Pregnancy: G5P0040 at [redacted]w[redacted]d  1. Supervision of high risk pregnancy, antepartum Stable PICA urges reviewed with pt. Pt is not acting on urges at this time and  counseled not to  2. History of preterm premature rupture of membranes (PROM) in previous pregnancy, currently pregnant in third trimester Growth scan next week  3. AVM (congenital arteriovenous malformation) Stable  4. Carrier of spinal muscular atrophy Stable  Preterm labor symptoms and general obstetric precautions including but not limited to vaginal bleeding, contractions, leaking of fluid and fetal movement were reviewed in detail with the patient. Please refer to After Visit Summary for other counseling recommendations.  Return in about 2 weeks (around 01/28/2021) for OB visit, face to face, MD only.   Hermina Staggers, MD

## 2021-01-14 NOTE — Progress Notes (Signed)
+   fetal movement. Pt states she is having intense cravings for sand, dirt, cornstarch, and chalk. States she has not ingested any.

## 2021-01-14 NOTE — Patient Instructions (Signed)

## 2021-01-20 ENCOUNTER — Telehealth: Payer: Self-pay

## 2021-01-20 ENCOUNTER — Other Ambulatory Visit: Payer: Self-pay

## 2021-01-20 ENCOUNTER — Encounter (HOSPITAL_COMMUNITY): Payer: Self-pay | Admitting: Obstetrics & Gynecology

## 2021-01-20 ENCOUNTER — Inpatient Hospital Stay (HOSPITAL_COMMUNITY)
Admission: AD | Admit: 2021-01-20 | Discharge: 2021-01-20 | Disposition: A | Discharge status: Home / Self Care | Payer: Medicaid Other | Attending: Obstetrics & Gynecology | Admitting: Obstetrics & Gynecology

## 2021-01-20 DIAGNOSIS — Z3A34 34 weeks gestation of pregnancy: Secondary | ICD-10-CM | POA: Diagnosis not present

## 2021-01-20 DIAGNOSIS — R109 Unspecified abdominal pain: Secondary | ICD-10-CM | POA: Diagnosis present

## 2021-01-20 DIAGNOSIS — O26893 Other specified pregnancy related conditions, third trimester: Secondary | ICD-10-CM | POA: Diagnosis not present

## 2021-01-20 DIAGNOSIS — N858 Other specified noninflammatory disorders of uterus: Secondary | ICD-10-CM | POA: Diagnosis not present

## 2021-01-20 DIAGNOSIS — M545 Low back pain, unspecified: Secondary | ICD-10-CM | POA: Diagnosis not present

## 2021-01-20 DIAGNOSIS — O99891 Other specified diseases and conditions complicating pregnancy: Secondary | ICD-10-CM | POA: Diagnosis not present

## 2021-01-20 DIAGNOSIS — M549 Dorsalgia, unspecified: Secondary | ICD-10-CM

## 2021-01-20 MED ORDER — CYCLOBENZAPRINE HCL 5 MG PO TABS
10.0000 mg | ORAL_TABLET | Freq: Once | ORAL | Status: AC
  Administered 2021-01-20: 10 mg via ORAL
  Filled 2021-01-20: qty 2

## 2021-01-20 MED ORDER — CYCLOBENZAPRINE HCL 10 MG PO TABS: 10.0000 mg | ORAL_TABLET | Freq: Two times a day (BID) | ORAL | 0 refills | Status: DC | PRN

## 2021-01-20 NOTE — Telephone Encounter (Signed)
Pt calls c/o preterm contractions. States they started last night and have been going on most of the day. Happening about every 3 mins lasting approx. 30 secs. Pt had approx 3 on the phone within the 5 min conversation we had and was definitely uncomfortable. Pt states she is not leaking fluid or bleeding. Pt advised baby is moving well. Advised patient to head to MAU for evaluation. Pt is agreeable to plan and verbalized understanding.

## 2021-01-20 NOTE — MAU Note (Signed)
Presents with c/o "abdominal tightness", pelvic pressure, and lower sharp back pain.  Reports tightness in abdomen is sporadic.  Denies VB or LOF.  Endorses +FM.

## 2021-01-20 NOTE — MAU Provider Note (Addendum)
History     CSN: [id number]  Arrival date and time: 01/20/21 1812   Event Date/Time   First Provider Initiated Contact with Patient 01/20/21 1924      Chief Complaint  Patient presents with   Contractions   Pelvic Pressure   Back Pain   HPI  [name] is a 25 y.o. female [id number] @ [redacted]w[redacted]d here with pressure in her pelvis, tightening in her abdomen. The pain comes and goes. She feels the pain/tightening every day.  The tightening in her abdomen became worse today around 1300. She called her OB office and was instructed to come into the hospital for evaluation.  No vaginal bleeding,  no leaking of fluid. + fetal movement.  She does not feel tightening in her abdomen currently, however she does rate her mid/lower back pain 7/10. She has not tried anything for the pain.   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    Family History  Problem Relation Age of Onset   Neuropathy Mother    Cancer Brother     Social History   Tobacco Use   Smoking status: Never   Smokeless tobacco: Never  Vaping Use   Vaping Use: Never used  Substance Use Topics   Alcohol use: No   Drug use: No    Allergies: No Known Allergies  Medications Prior to Admission  Medication Sig Dispense Refill Last Dose   Blood Pressure Monitoring (BLOOD PRESSURE KIT) DEVI Monitor Blood Pressure Readings at Home Regularly 1 each 0 01/20/2021   Prenatal Vit-Fe Fumarate-FA (PREPLUS) 27-1 MG TABS Take 1 tablet by mouth daily. 30 tablet 13 01/20/2021   No results found for this or any previous visit  (from the past 48 hour(s)).   Review of Systems  Constitutional:  Negative for fever.  Genitourinary:  Positive for pelvic pain.  Musculoskeletal:  Positive for back pain.  Physical Exam   Blood pressure 131/75, pulse (!) 107, temperature 98 F (36.7 C), temperature source Oral, resp. rate 20, height '5\' 2"'  (1.575 m), weight 81.8 kg, last menstrual period 05/25/2020, SpO2 100 %, unknown if currently breastfeeding.  Physical Exam Constitutional:      General: She is not in acute distress.    Appearance: Normal appearance. She is not ill-appearing, toxic-appearing or diaphoretic.  Abdominal:     Palpations: Abdomen is soft.  Genitourinary:    Comments: Dilation: Closed Effacement (%): Thick Exam by:: JNoni Saupe NP  Musculoskeletal:     Lumbar back: Tenderness present. No swelling, edema or deformity. Normal range of motion.       Back:  Skin:    General: Skin is warm.     Capillary Refill: Capillary refill takes more than 3 seconds.  Neurological:  Mental Status: She is alert and oriented to person, place, and time.  Psychiatric:        Behavior: Behavior normal.   Fetal Tracing:  Baseline: 145 bpm Variability: moderate  Accelerations: 15x15 Decelerations: Variable  Toco:  UI  MAU Course  Procedures None  MDM  Flexeril 10 mg PO with oral hydration.  Report given to Hansel Feinstein CNM Who resumes care of the patient.  Rasch, Artist Pais, NP  Back pain was completely relieved by Flexeril  Has appt tomorrow in office  Assessment and Plan  A:  Single IUP at [redacted]w[redacted]d      Uterine irritability       Low back pain  P:   Discharge home       Preterm labor precautions       Rx Flexeril #15tab sent to pharmacy for back pain at home       Followup in office as scheduled tomorrow       Encouraged to return if she develops worsening of symptoms, increase in pain, fever, or other concerning symptoms.   WSeabron Spates CNM

## 2021-01-21 ENCOUNTER — Ambulatory Visit: Payer: Medicaid Other | Attending: Obstetrics

## 2021-01-21 ENCOUNTER — Encounter: Payer: Self-pay | Admitting: *Deleted

## 2021-01-21 ENCOUNTER — Ambulatory Visit: Payer: Medicaid Other | Admitting: *Deleted

## 2021-01-21 ENCOUNTER — Other Ambulatory Visit: Payer: Self-pay | Admitting: *Deleted

## 2021-01-21 VITALS — BP 116/74 | HR 102

## 2021-01-21 DIAGNOSIS — Z3A34 34 weeks gestation of pregnancy: Secondary | ICD-10-CM

## 2021-01-21 DIAGNOSIS — O365930 Maternal care for other known or suspected poor fetal growth, third trimester, not applicable or unspecified: Secondary | ICD-10-CM

## 2021-01-21 DIAGNOSIS — Z3689 Encounter for other specified antenatal screening: Secondary | ICD-10-CM | POA: Insufficient documentation

## 2021-01-21 DIAGNOSIS — Z8774 Personal history of (corrected) congenital malformations of heart and circulatory system: Secondary | ICD-10-CM

## 2021-01-21 DIAGNOSIS — O09293 Supervision of pregnancy with other poor reproductive or obstetric history, third trimester: Secondary | ICD-10-CM

## 2021-01-21 DIAGNOSIS — O365931 Maternal care for other known or suspected poor fetal growth, third trimester, fetus 1: Secondary | ICD-10-CM | POA: Diagnosis not present

## 2021-01-21 DIAGNOSIS — Z362 Encounter for other antenatal screening follow-up: Secondary | ICD-10-CM

## 2021-01-21 DIAGNOSIS — O3430 Maternal care for cervical incompetence, unspecified trimester: Secondary | ICD-10-CM

## 2021-01-29 ENCOUNTER — Other Ambulatory Visit: Payer: Self-pay | Admitting: Obstetrics & Gynecology

## 2021-01-29 ENCOUNTER — Encounter: Payer: Self-pay | Admitting: Obstetrics

## 2021-01-29 ENCOUNTER — Other Ambulatory Visit (HOSPITAL_COMMUNITY)
Admission: RE | Admit: 2021-01-29 | Discharge: 2021-01-29 | Disposition: A | Discharge status: Home / Self Care | Payer: Medicaid Other | Source: Ambulatory Visit | Attending: Obstetrics & Gynecology | Admitting: Obstetrics & Gynecology

## 2021-01-29 ENCOUNTER — Ambulatory Visit (INDEPENDENT_AMBULATORY_CARE_PROVIDER_SITE_OTHER): Payer: Medicaid Other | Admitting: Obstetrics & Gynecology

## 2021-01-29 ENCOUNTER — Other Ambulatory Visit: Payer: Self-pay

## 2021-01-29 VITALS — BP 119/72 | HR 118 | Wt 181.2 lb

## 2021-01-29 DIAGNOSIS — O0990 Supervision of high risk pregnancy, unspecified, unspecified trimester: Secondary | ICD-10-CM | POA: Insufficient documentation

## 2021-01-29 DIAGNOSIS — Z8742 Personal history of other diseases of the female genital tract: Secondary | ICD-10-CM

## 2021-01-29 DIAGNOSIS — O09293 Supervision of pregnancy with other poor reproductive or obstetric history, third trimester: Secondary | ICD-10-CM

## 2021-01-29 NOTE — Progress Notes (Signed)
   PRENATAL VISIT NOTE  Subjective:  Maria Orr is a 25 y.o. G5P0040 at [redacted]w[redacted]d being seen today for ongoing prenatal care.  She is currently monitored for the following issues for this high-risk pregnancy and has AVM (congenital arteriovenous malformation); Supervision of high risk pregnancy, antepartum; Venous vascular malformations; History of ectopic pregnancy; History of cervical incompetence; Pregnancy with poor obstetric history; Carrier of spinal muscular atrophy; SUI (stress urinary incontinence, female); and History of preterm premature rupture of membranes (PROM) in previous pregnancy, currently pregnant on their problem list.  Patient reports occasional contractions.  Contractions: Irritability. Vag. Bleeding: None.  Movement: Present. Denies leaking of fluid.   The following portions of the patient's history were reviewed and updated as appropriate: allergies, current medications, past family history, past medical history, past social history, past surgical history and problem list.   Objective:   Vitals:   01/29/21 0929  BP: 119/72  Pulse: (!) 118  Weight: 181 lb 3.2 oz (82.2 kg)    Fetal Status:     Movement: Present     General:  Alert, oriented and cooperative. Patient is in no acute distress.  Skin: Skin is warm and dry. No rash noted.   Cardiovascular: Normal heart rate noted  Respiratory: Normal respiratory effort, no problems with respiration noted  Abdomen: Soft, gravid, appropriate for gestational age.  Pain/Pressure: Present     Pelvic: Cervical exam performed in the presence of a chaperone        Extremities: Normal range of motion.     Mental Status: Normal mood and affect. Normal behavior. Normal judgment and thought content.   Assessment and Plan:  Pregnancy: G5P0040 at [redacted]w[redacted]d 1. Supervision of high risk pregnancy, antepartum [place] cerclage - Culture, beta strep (group b only) - GC/Chlamydia probe amp ([place])not at [facility]  2. History of cervical  incompetence   3. History of preterm premature rupture of membranes (PROM) in previous pregnancy, currently pregnant in third trimester   Preterm labor symptoms and general obstetric precautions including but not limited to vaginal bleeding, contractions, leaking of fluid and fetal movement were reviewed in detail with the patient. Please refer to After Visit Summary for other counseling recommendations.   Return in about 1 week (around 02/05/2021).  Future Appointments  Date Time Provider Department Center  02/09/2021  7:30 AM [facility] NURSE [facility]  02/09/2021  7:45 AM [facility] US5 [facility]    Scheryl Darter, MD

## 2021-02-01 LAB — GC/CHLAMYDIA PROBE AMP (~~LOC~~) NOT AT ARMC
Chlamydia: NEGATIVE
Comment: NEGATIVE
Comment: NORMAL
Neisseria Gonorrhea: NEGATIVE

## 2021-02-02 LAB — CULTURE, BETA STREP (GROUP B ONLY): Strep Gp B Culture: NEGATIVE

## 2021-02-05 ENCOUNTER — Other Ambulatory Visit: Payer: Self-pay

## 2021-02-05 ENCOUNTER — Ambulatory Visit (INDEPENDENT_AMBULATORY_CARE_PROVIDER_SITE_OTHER): Payer: Medicaid Other | Admitting: Obstetrics and Gynecology

## 2021-02-05 ENCOUNTER — Encounter: Payer: Self-pay | Admitting: Obstetrics and Gynecology

## 2021-02-05 VITALS — BP 120/78 | HR 118 | Wt 181.0 lb

## 2021-02-05 DIAGNOSIS — O0993 Supervision of high risk pregnancy, unspecified, third trimester: Secondary | ICD-10-CM

## 2021-02-05 DIAGNOSIS — Z3A36 36 weeks gestation of pregnancy: Secondary | ICD-10-CM

## 2021-02-05 DIAGNOSIS — O0990 Supervision of high risk pregnancy, unspecified, unspecified trimester: Secondary | ICD-10-CM

## 2021-02-05 DIAGNOSIS — O09293 Supervision of pregnancy with other poor reproductive or obstetric history, third trimester: Secondary | ICD-10-CM

## 2021-02-05 NOTE — Patient Instructions (Signed)
Vaginal Delivery ?Vaginal delivery means that you give birth by pushing your baby out of your birth canal (vagina). Your health care team will help you before, during, and after vaginal delivery. ?Birth experiences are unique for every woman and every pregnancy, and birth experiences vary depending on where you choose to give birth. ?What are the risks and benefits? ?Generally, this is safe. However, problems may occur, including: ?Bleeding. ?Infection. ?Damage to other structures such as vaginal tearing. ?Allergic reactions to medicines. ?Despite the risks, benefits of vaginal delivery include less risk of bleeding and infection and a shorter recovery time compared to a Cesarean delivery. Cesarean delivery, or C-section, is the surgical delivery of a baby. ?What happens when I arrive at the birth center or hospital? ?Once you are in labor and have been admitted into the hospital or birth center, your health care team may: ?Review your pregnancy history and any concerns that you have. ?Talk with you about your birth plan and discuss pain control options. ?Check your blood pressure, breathing, and heartbeat. ?Assess your baby's heartbeat. ?Monitor your uterus for contractions. ?Check whether your bag of water (amniotic sac) has broken (ruptured). ?Insert an IV into one of your veins. This may be used to give you fluids and medicines. ?Monitoring ?Your health care team may assess your contractions (uterine monitoring) and your baby's heart rate (fetal monitoring). You may need to be monitored: ?Often, but not continuously (intermittently). ?All the time or for long periods at a time (continuously). Continuous monitoring may be needed if: ?You are taking certain medicines, such as medicine to relieve pain or make your contractions stronger. ?You have pregnancy or labor complications. ?Monitoring may be done by: ?Placing a special stethoscope or a handheld monitoring device on your abdomen to check your baby's heartbeat  and to check for contractions. ?Placing monitors on your abdomen (external monitors) to record your baby's heartbeat and the frequency and length of contractions. ?Placing monitors inside your uterus through your vagina (internal monitors) to record your baby's heartbeat and the frequency, length, and strength of your contractions. Depending on the type of monitor, it may remain in your uterus or on your baby's head until birth. ?Telemetry. This is a type of continuous monitoring that can be done with external or internal monitors. Instead of having to stay in bed, you are able to move around. ?Physical exam ?Your health care team may perform frequent physical exams. This may include: ?Checking how and where your baby is positioned in your uterus. ?Checking your cervix to determine: ?Whether it is thinning out (effacing). ?Whether it is opening up (dilating). ?What happens during labor and delivery? ?Normal labor and delivery is divided into the following three stages: ?Stage 1 ?This is the longest stage of labor. ?Throughout this stage, you will feel contractions. Contractions generally feel mild, infrequent, and irregular at first. They get stronger, more frequent, and more regular as you move through this stage. You may have contractions about every 2-3 minutes. ?This stage ends when your cervix is completely dilated to 4 inches (10 cm) and completely effaced. ?Stage 2 ?This stage starts once your cervix is completely effaced and dilated and lasts until the delivery of your baby. ?This is the stage where you will feel an urge to push your baby out of your vagina. ?You may feel stretching and burning pain, especially when the widest part of your baby's head passes through the vaginal opening (crowning). ?Once your baby is delivered, the umbilical cord will be clamped and   cut. Timing of cutting the cord will depend on your wishes, your baby's health, and your health care provider's practices. ?Your baby will be  placed on your bare chest (skin-to-skin contact) in an upright position and covered with a warm blanket. If you are choosing to breastfeed, watch your baby for feeding cues, like rooting or sucking, and help the baby to your breast for his or her first feeding. ?Stage 3 ?This stage starts immediately after the birth of your baby and ends after you deliver the placenta. ?This stage may take anywhere from 5 to 30 minutes. ?After your baby has been delivered, you will feel contractions as your body expels the placenta. These contractions also help your uterus get smaller and reduce bleeding. ?What can I expect after labor and delivery? ?After labor is over, you and your baby will be assessed closely until you are ready to go home. Your health care team will teach you how to care for yourself and your baby. ?You and your baby may be encouraged to stay in the same room (rooming in) during your hospital stay. This will help promote early bonding and successful breastfeeding. ?Your uterus will be checked and massaged regularly (fundal massage). ?You may continue to receive fluids and medicines through an IV. ?You will have some soreness and pain in your abdomen, vagina, and the area of skin between your vaginal opening and your anus (perineum). ?If an incision was made near your vagina (episiotomy) or if you had some vaginal tearing during delivery, cold compresses may be placed on your episiotomy or your tear. This helps to reduce pain and swelling. ?It is normal to have vaginal bleeding after delivery. Wear a sanitary pad for vaginal bleeding and discharge. ?Summary ?Vaginal delivery means that you will give birth by pushing your baby out of your birth canal (vagina). ?Your health care team will monitor you and your baby throughout the stages of labor. ?After you deliver your baby, your health care team will continue to assess you and your baby to ensure you are both recovering as expected after delivery. ?This  information is not intended to replace advice given to you by your health care provider. Make sure you discuss any questions you have with your health care provider. ?Document Revised: 02/17/2020 Document Reviewed: 02/17/2020 ?Elsevier Patient Education ? 2022 [employer] ? ?

## 2021-02-05 NOTE — Progress Notes (Signed)
Subjective:  Maria Orr is a 25 y.o. [id number] at [redacted]w[redacted]d being seen today for ongoing prenatal care.  She is currently monitored for the following issues for this high-risk pregnancy and has AVM (congenital arteriovenous malformation); Supervision of high risk pregnancy, antepartum; Venous vascular malformations; History of ectopic pregnancy; History of cervical incompetence; Pregnancy with poor obstetric history; Carrier of spinal muscular atrophy; SUI (stress urinary incontinence, female); and History of preterm premature rupture of membranes (PROM) in previous pregnancy, currently pregnant on their problem list.  Patient reports no complaints.  Contractions: Not present. Vag. Bleeding: None.  Movement: Present. Denies leaking of fluid.   The following portions of the patient's history were reviewed and updated as appropriate: allergies, current medications, past family history, past medical history, past social history, past surgical history and problem list. Problem list updated.  Objective:   Vitals:   02/05/21 1024  BP: 120/78  Pulse: (!) 118  Weight: 181 lb (82.1 kg)    Fetal Status:     Movement: Present     General:  Alert, oriented and cooperative. Patient is in no acute distress.  Skin: Skin is warm and dry. No rash noted.   Cardiovascular: Normal heart rate noted  Respiratory: Normal respiratory effort, no problems with respiration noted  Abdomen: Soft, gravid, appropriate for gestational age. Pain/Pressure: Present     Pelvic:  Cervical exam deferred        Extremities: Normal range of motion.  Edema: None  Mental Status: Normal mood and affect. Normal behavior. Normal judgment and thought content.   Urinalysis:      Assessment and Plan:  Pregnancy: G5P0040 at [redacted]w[redacted]d  1. Supervision of high risk pregnancy, antepartum Stable Labor precautions  2. History of preterm premature rupture of membranes (PROM) in previous pregnancy, currently pregnant in third  trimester Stable F/U growth scan 02/09/21  Term labor symptoms and general obstetric precautions including but not limited to vaginal bleeding, contractions, leaking of fluid and fetal movement were reviewed in detail with the patient. Please refer to After Visit Summary for other counseling recommendations.  Return in about 1 week (around 02/12/2021) for OB visit, face to face, MD only.   Hermina Staggers, MD

## 2021-02-05 NOTE — Progress Notes (Signed)
+   Fetal movement. No complaints.  

## 2021-02-09 ENCOUNTER — Other Ambulatory Visit: Payer: Self-pay

## 2021-02-09 ENCOUNTER — Other Ambulatory Visit: Payer: Self-pay | Admitting: Obstetrics and Gynecology

## 2021-02-09 ENCOUNTER — Ambulatory Visit: Payer: Medicaid Other | Admitting: *Deleted

## 2021-02-09 ENCOUNTER — Encounter: Payer: Self-pay | Admitting: *Deleted

## 2021-02-09 ENCOUNTER — Ambulatory Visit (HOSPITAL_BASED_OUTPATIENT_CLINIC_OR_DEPARTMENT_OTHER): Payer: Medicaid Other | Admitting: Obstetrics

## 2021-02-09 ENCOUNTER — Ambulatory Visit: Payer: Medicaid Other | Attending: Obstetrics and Gynecology

## 2021-02-09 VITALS — BP 115/81 | HR 105

## 2021-02-09 DIAGNOSIS — O3433 Maternal care for cervical incompetence, third trimester: Secondary | ICD-10-CM

## 2021-02-09 DIAGNOSIS — O09293 Supervision of pregnancy with other poor reproductive or obstetric history, third trimester: Secondary | ICD-10-CM | POA: Diagnosis not present

## 2021-02-09 DIAGNOSIS — O365930 Maternal care for other known or suspected poor fetal growth, third trimester, not applicable or unspecified: Secondary | ICD-10-CM | POA: Diagnosis present

## 2021-02-09 DIAGNOSIS — Z8774 Personal history of (corrected) congenital malformations of heart and circulatory system: Secondary | ICD-10-CM

## 2021-02-09 DIAGNOSIS — Z3A37 37 weeks gestation of pregnancy: Secondary | ICD-10-CM | POA: Diagnosis present

## 2021-02-09 DIAGNOSIS — Z3689 Encounter for other specified antenatal screening: Secondary | ICD-10-CM | POA: Insufficient documentation

## 2021-02-09 DIAGNOSIS — O3430 Maternal care for cervical incompetence, unspecified trimester: Secondary | ICD-10-CM | POA: Insufficient documentation

## 2021-02-09 NOTE — Progress Notes (Signed)
MFM Note  Maria Orr was seen for a follow up growth scan as borderline IUGR was noted during her last exam.  She denies any problems since her last exam and reports feeling vigorous fetal movements throughout the day.  The patient reports that her cervix was already 3 cm dilated last week.  On today's exam, the EFW (5 pounds 6 ounces) measures at the 5th percentile for her gestational age indicating fetal growth restriction.  Low normal amniotic fluid with a total AFI of 6 cm is noted.  There was a maximal vertical pocket of 4 cm present.    A biophysical profile performed today due to fetal growth restriction was 8 out of 8.    Doppler studies of the umbilical arteries showed a normal S/D ratio of 2.32.  There were no signs of absent or reversed end-diastolic flow.    Due to IUGR, delivery is recommended as soon as possible.  I have contacted Dr. Debroah Loop who will schedule her induction.  A total of 20 minutes was spent counseling and coordinating the care for this patient.  Greater than 50% of the time was spent in direct face-to-face contact.

## 2021-02-10 ENCOUNTER — Encounter: Payer: Self-pay | Admitting: Obstetrics & Gynecology

## 2021-02-10 ENCOUNTER — Other Ambulatory Visit: Payer: Self-pay | Admitting: Advanced Practice Midwife

## 2021-02-11 ENCOUNTER — Encounter (HOSPITAL_COMMUNITY): Payer: Self-pay | Admitting: Obstetrics and Gynecology

## 2021-02-11 ENCOUNTER — Inpatient Hospital Stay (HOSPITAL_COMMUNITY): Payer: Medicaid Other | Admitting: Anesthesiology

## 2021-02-11 ENCOUNTER — Other Ambulatory Visit: Payer: Self-pay

## 2021-02-11 ENCOUNTER — Inpatient Hospital Stay (HOSPITAL_COMMUNITY)
Admission: AD | Admit: 2021-02-11 | Discharge: 2021-02-13 | DRG: 806 | Disposition: A | Discharge status: Home / Self Care | Payer: Medicaid Other | Attending: Obstetrics & Gynecology | Admitting: Obstetrics & Gynecology

## 2021-02-11 ENCOUNTER — Inpatient Hospital Stay (HOSPITAL_COMMUNITY): Payer: Medicaid Other

## 2021-02-11 DIAGNOSIS — Q2730 Arteriovenous malformation, site unspecified: Secondary | ICD-10-CM

## 2021-02-11 DIAGNOSIS — Z148 Genetic carrier of other disease: Secondary | ICD-10-CM

## 2021-02-11 DIAGNOSIS — O99892 Other specified diseases and conditions complicating childbirth: Secondary | ICD-10-CM | POA: Diagnosis present

## 2021-02-11 DIAGNOSIS — O365930 Maternal care for other known or suspected poor fetal growth, third trimester, not applicable or unspecified: Principal | ICD-10-CM | POA: Diagnosis present

## 2021-02-11 DIAGNOSIS — Z8614 Personal history of Methicillin resistant Staphylococcus aureus infection: Secondary | ICD-10-CM

## 2021-02-11 DIAGNOSIS — O326XX0 Maternal care for compound presentation, not applicable or unspecified: Secondary | ICD-10-CM

## 2021-02-11 DIAGNOSIS — Z3A37 37 weeks gestation of pregnancy: Secondary | ICD-10-CM

## 2021-02-11 DIAGNOSIS — Z8742 Personal history of other diseases of the female genital tract: Secondary | ICD-10-CM

## 2021-02-11 DIAGNOSIS — O365990 Maternal care for other known or suspected poor fetal growth, unspecified trimester, not applicable or unspecified: Secondary | ICD-10-CM | POA: Diagnosis present

## 2021-02-11 LAB — RPR: RPR Ser Ql: NONREACTIVE

## 2021-02-11 LAB — CBC
HCT: 36.9 % (ref 36.0–46.0)
Hemoglobin: 11.8 g/dL — ABNORMAL LOW (ref 12.0–15.0)
MCH: 26.7 pg (ref 26.0–34.0)
MCHC: 32 g/dL (ref 30.0–36.0)
MCV: 83.5 fL (ref 80.0–100.0)
Platelets: 346 10*3/uL (ref 150–400)
RBC: 4.42 MIL/uL (ref 3.87–5.11)
RDW: 14.4 % (ref 11.5–15.5)
WBC: 11.6 10*3/uL — ABNORMAL HIGH (ref 4.0–10.5)
nRBC: 0.2 % (ref 0.0–0.2)

## 2021-02-11 LAB — TYPE AND SCREEN
ABO/RH(D): A POS
Antibody Screen: NEGATIVE

## 2021-02-11 MED ORDER — DIPHENHYDRAMINE HCL 25 MG PO CAPS: 25.0000 mg | ORAL_CAPSULE | Freq: Four times a day (QID) | ORAL | Status: DC | PRN

## 2021-02-11 MED ORDER — OXYTOCIN-SODIUM CHLORIDE 30-0.9 UT/500ML-% IV SOLN
INTRAVENOUS | Status: AC
  Administered 2021-02-11: 2 m[IU]/min via INTRAVENOUS
  Filled 2021-02-11: qty 500

## 2021-02-11 MED ORDER — LACTATED RINGERS IV SOLN
500.0000 mL | INTRAVENOUS | Status: DC | PRN
  Administered 2021-02-11: 500 mL via INTRAVENOUS

## 2021-02-11 MED ORDER — OXYCODONE-ACETAMINOPHEN 5-325 MG PO TABS: 2.0000 | ORAL_TABLET | ORAL | Status: DC | PRN

## 2021-02-11 MED ORDER — MISOPROSTOL 50MCG HALF TABLET
ORAL_TABLET | ORAL | Status: AC
  Filled 2021-02-11: qty 1

## 2021-02-11 MED ORDER — EPHEDRINE 5 MG/ML INJ: 10.0000 mg | INTRAVENOUS | Status: DC | PRN

## 2021-02-11 MED ORDER — PHENYLEPHRINE 40 MCG/ML (10ML) SYRINGE FOR IV PUSH (FOR BLOOD PRESSURE SUPPORT)
80.0000 ug | PREFILLED_SYRINGE | INTRAVENOUS | Status: DC | PRN
  Filled 2021-02-11: qty 10

## 2021-02-11 MED ORDER — FENTANYL-BUPIVACAINE-NACL 0.5-0.125-0.9 MG/250ML-% EP SOLN
EPIDURAL | Status: DC | PRN
  Administered 2021-02-11: 12 mL/h via EPIDURAL

## 2021-02-11 MED ORDER — WITCH HAZEL-GLYCERIN EX PADS
1.0000 "application " | MEDICATED_PAD | CUTANEOUS | Status: DC | PRN
  Administered 2021-02-11: 1 via TOPICAL

## 2021-02-11 MED ORDER — SOD CITRATE-CITRIC ACID 500-334 MG/5ML PO SOLN: 30.0000 mL | ORAL | Status: DC | PRN

## 2021-02-11 MED ORDER — ONDANSETRON HCL 4 MG/2ML IJ SOLN: 4.0000 mg | INTRAMUSCULAR | Status: DC | PRN

## 2021-02-11 MED ORDER — OXYTOCIN BOLUS FROM INFUSION
333.0000 mL | Freq: Once | INTRAVENOUS | Status: AC
  Administered 2021-02-11: 333 mL via INTRAVENOUS

## 2021-02-11 MED ORDER — OXYCODONE-ACETAMINOPHEN 5-325 MG PO TABS: 1.0000 | ORAL_TABLET | ORAL | Status: DC | PRN

## 2021-02-11 MED ORDER — FENTANYL-BUPIVACAINE-NACL 0.5-0.125-0.9 MG/250ML-% EP SOLN
12.0000 mL/h | EPIDURAL | Status: DC | PRN
  Filled 2021-02-11: qty 250

## 2021-02-11 MED ORDER — IBUPROFEN 600 MG PO TABS
600.0000 mg | ORAL_TABLET | Freq: Four times a day (QID) | ORAL | Status: DC
  Filled 2021-02-11: qty 1

## 2021-02-11 MED ORDER — LIDOCAINE HCL (PF) 1 % IJ SOLN
INTRAMUSCULAR | Status: DC | PRN
  Administered 2021-02-11: 2 mL via EPIDURAL
  Administered 2021-02-11: 10 mL via EPIDURAL

## 2021-02-11 MED ORDER — MISOPROSTOL 25 MCG QUARTER TABLET
25.0000 ug | ORAL_TABLET | ORAL | Status: DC | PRN
  Filled 2021-02-11: qty 1

## 2021-02-11 MED ORDER — DIPHENHYDRAMINE HCL 50 MG/ML IJ SOLN: 12.5000 mg | INTRAMUSCULAR | Status: DC | PRN

## 2021-02-11 MED ORDER — OXYCODONE HCL 5 MG PO TABS
5.0000 mg | ORAL_TABLET | Freq: Four times a day (QID) | ORAL | Status: DC | PRN
  Administered 2021-02-11 – 2021-02-12 (×3): 5 mg via ORAL
  Filled 2021-02-11 (×3): qty 1

## 2021-02-11 MED ORDER — ONDANSETRON HCL 4 MG/2ML IJ SOLN
4.0000 mg | Freq: Four times a day (QID) | INTRAMUSCULAR | Status: DC | PRN
  Administered 2021-02-11: 4 mg via INTRAVENOUS
  Filled 2021-02-11: qty 2

## 2021-02-11 MED ORDER — SIMETHICONE 80 MG PO CHEW: 80.0000 mg | CHEWABLE_TABLET | ORAL | Status: DC | PRN

## 2021-02-11 MED ORDER — BENZOCAINE-MENTHOL 20-0.5 % EX AERO
1.0000 "application " | INHALATION_SPRAY | CUTANEOUS | Status: DC | PRN
  Administered 2021-02-11: 1 via TOPICAL
  Filled 2021-02-11: qty 56

## 2021-02-11 MED ORDER — LACTATED RINGERS IV SOLN: INTRAVENOUS | Status: DC

## 2021-02-11 MED ORDER — TERBUTALINE SULFATE 1 MG/ML IJ SOLN: 0.2500 mg | Freq: Once | INTRAMUSCULAR | Status: DC | PRN

## 2021-02-11 MED ORDER — DIBUCAINE (PERIANAL) 1 % EX OINT: 1.0000 "application " | TOPICAL_OINTMENT | CUTANEOUS | Status: DC | PRN

## 2021-02-11 MED ORDER — OXYTOCIN-SODIUM CHLORIDE 30-0.9 UT/500ML-% IV SOLN: 1.0000 m[IU]/min | INTRAVENOUS | Status: DC

## 2021-02-11 MED ORDER — TETANUS-DIPHTH-ACELL PERTUSSIS 5-2.5-18.5 LF-MCG/0.5 IM SUSY: 0.5000 mL | PREFILLED_SYRINGE | Freq: Once | INTRAMUSCULAR | Status: DC

## 2021-02-11 MED ORDER — LACTATED RINGERS IV SOLN
500.0000 mL | Freq: Once | INTRAVENOUS | Status: AC
  Administered 2021-02-11: 500 mL via INTRAVENOUS

## 2021-02-11 MED ORDER — LIDOCAINE HCL (PF) 1 % IJ SOLN: 30.0000 mL | INTRAMUSCULAR | Status: DC | PRN

## 2021-02-11 MED ORDER — ACETAMINOPHEN 325 MG PO TABS: 650.0000 mg | ORAL_TABLET | ORAL | Status: DC | PRN

## 2021-02-11 MED ORDER — PHENYLEPHRINE 40 MCG/ML (10ML) SYRINGE FOR IV PUSH (FOR BLOOD PRESSURE SUPPORT): 80.0000 ug | PREFILLED_SYRINGE | INTRAVENOUS | Status: DC | PRN

## 2021-02-11 MED ORDER — ONDANSETRON HCL 4 MG PO TABS: 4.0000 mg | ORAL_TABLET | ORAL | Status: DC | PRN

## 2021-02-11 MED ORDER — SENNOSIDES-DOCUSATE SODIUM 8.6-50 MG PO TABS
2.0000 | ORAL_TABLET | Freq: Every day | ORAL | Status: DC
  Administered 2021-02-12 – 2021-02-13 (×2): 2 via ORAL
  Filled 2021-02-11 (×2): qty 2

## 2021-02-11 MED ORDER — OXYTOCIN-SODIUM CHLORIDE 30-0.9 UT/500ML-% IV SOLN: 2.5000 [IU]/h | INTRAVENOUS | Status: DC

## 2021-02-11 MED ORDER — MISOPROSTOL 50MCG HALF TABLET
50.0000 ug | ORAL_TABLET | ORAL | Status: DC | PRN
  Administered 2021-02-11: 50 ug via BUCCAL

## 2021-02-11 MED ORDER — PRENATAL MULTIVITAMIN CH
1.0000 | ORAL_TABLET | Freq: Every day | ORAL | Status: DC
  Administered 2021-02-12 – 2021-02-13 (×2): 1 via ORAL
  Filled 2021-02-11: qty 1

## 2021-02-11 MED ORDER — COCONUT OIL OIL: 1.0000 "application " | TOPICAL_OIL | Status: DC | PRN

## 2021-02-11 MED ORDER — FENTANYL CITRATE (PF) 100 MCG/2ML IJ SOLN: 100.0000 ug | INTRAMUSCULAR | Status: DC | PRN

## 2021-02-11 MED ORDER — TERBUTALINE SULFATE 1 MG/ML IJ SOLN
0.2500 mg | Freq: Once | INTRAMUSCULAR | Status: AC | PRN
  Administered 2021-02-11: 0.25 mg via SUBCUTANEOUS
  Filled 2021-02-11: qty 1

## 2021-02-11 NOTE — Progress Notes (Signed)
Pt c/o crampy pain.  Says she can't take any kind of NSAID d/t AV malformation.  Oxycodone ordered.

## 2021-02-11 NOTE — H&P (Addendum)
OBSTETRIC ADMISSION HISTORY AND PHYSICAL  Maria Orr is a 25 y.o. female [id number] with IUP at 42w3dby LMP presenting for IOL due to FCamino She reports +FMs, No LOF, no VB, no blurry vision, headaches or peripheral edema, and RUQ pain.  She plans on breast and bottle feeding. She plans on using POPs for birth control. She received her prenatal care at  [facility]    Dating: By LMP --->  Estimated Date of Delivery: 03/01/21  Sono:    @[redacted]w[redacted]d , CWD, normal anatomy, cephalic presentation, 22130Q 5% EFW, low-nl AFI of 6cm; nl dopplers   Prenatal History/Complications: FGR, past history of 3 ABs and 1 ectopic pregnancy, and a 17wk previable delivery. She also has a right arm congenital AV malformation and cannot be on blood thinners.   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    Obstetrical History: OB History     Gravida  5   Para  0   Term      Preterm      AB  4   Living  0      SAB  2   IAB      Ectopic  1   Multiple      Live Births  0           Social History Social History   Socioeconomic History   Marital status: Single    Spouse name: Not on file   Number of children: Not on file   Years of education: Not on file   Highest education level: Not on file  Occupational History   Not on file  Tobacco Use   Smoking status: Never   Smokeless tobacco: Never  Vaping Use   Vaping Use: Never used  Substance and Sexual Activity   Alcohol use: No   Drug use: No   Sexual activity: Not Currently    Birth control/protection: None  Other Topics Concern   Not on file  Social History Narrative   Not on file   Social Determinants of Health   Financial Resource Strain: Not on  file  Food Insecurity: Not on file  Transportation Needs: Not on file  Physical Activity: Not on file  Stress: Not on file  Social Connections: Not on file    Family History: Family History  Problem Relation Age of Onset   Neuropathy Mother    Cancer Brother     Allergies: No Known Allergies  Medications Prior to Admission  Medication Sig Dispense Refill Last Dose   Blood Pressure Monitoring (BLOOD PRESSURE KIT) DEVI Monitor Blood Pressure Readings at Home Regularly 1 each 0 02/10/2021   Prenatal Vit-Fe Fumarate-FA (PREPLUS) 27-1 MG TABS Take 1 tablet by mouth daily. 30 tablet 13 02/10/2021   cyclobenzaprine (FLEXERIL) 10 MG tablet Take 1 tablet (10 mg total) by mouth 2 (two) times daily as needed for muscle spasms. (Patient not taking: No sig reported) 15 tablet 0      Review of Systems   All systems reviewed and negative except as stated in HPI  Last menstrual period 05/25/2020, unknown if currently breastfeeding. General appearance: alert and no distress Lungs: clear to  auscultation bilaterally Heart: regular rate and rhythm Abdomen: soft, non-tender; bowel sounds normal Extremities: Homans sign is negative, no sign of DVT Presentation: cephalic Fetal monitoringBaseline: 145 bpm, Variability: Good {> 6 bpm), and Accelerations: Reactive Uterine activityNone  Dilation: 2 Effacement (%): Thick Cervical Position: Posterior Station: -3 Presentation: Vertex Exam by:: Dr. 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  Patient Active Problem List   Diagnosis Date Noted   Intrauterine growth restriction (IUGR) affecting care of mother 02/11/2021   History of preterm premature rupture of membranes (PROM) in previous pregnancy, currently pregnant 01/14/2021   SUI (stress urinary incontinence, female) 10/11/2020   History of ectopic pregnancy 09/03/2020   History of cervical incompetence 09/03/2020   Pregnancy with poor obstetric history 09/03/2020   Carrier of spinal muscular atrophy 09/03/2020   Supervision of high risk pregnancy, antepartum 07/30/2020   Venous vascular malformations 04/23/2015   AVM (congenital arteriovenous malformation) 02/09/2015    Assessment/Plan:  Maria Orr is a 25 y.o. [id number] at 62w3dhere for IOL for IUGR.  #Labor: Patient presents for IOL. She has been started on buccal cytotec and we will reassess her cervix in 4 hours. #Pain: Requests epidural when appropriate. #FWB: Category 1 #ID:  GBS negative #MOF: Breast and bottle #MOC:POPs at 6 week follow up. #Circ:  Yes, needs consent  PGertie Fey Medical Student  02/11/2021, 12:36 AM  CNM attestation:  I have seen and examined this patient; I agree with above documentation in the resident's note.   AMarlisha Vanwykis a 25y.o. [id number] for IOL due to FGR (EFW 5th%, nl dopplers).  PE: BP 124/74   Pulse (!) 101   Temp 98.9 F (37.2 C) (Oral)   Resp 15   LMP 05/25/2020   SpO2 100%  Gen: calm comfortable, NAD Resp: normal effort, no distress Abd: gravid  ROS, labs, PMH reviewed  Plan: Admit to L&D Plan cx ripening with cytotec first, [place]/01/2021, 4:59 AM

## 2021-02-11 NOTE — Progress Notes (Signed)
Labor Progress Note Maria Orr is a 25 y.o. G5P0040 at [redacted]w[redacted]d presented for IOL for FGR  S:  Comfortable with epidural, feeling strong FM.   O:  BP (!) 117/59 (BP Location: Left Arm)   Pulse (!) 116   Temp 98.5 F (36.9 C) (Oral)   Resp 16   LMP 05/25/2020   SpO2 100%  EFM: baseline 135 bpm/ mod variability/ + accels/ variable decels  Toco/IUPC: q4 SVE: Dilation: 7 Effacement (%): 100 Cervical Position: Posterior Station: -2 Presentation: Vertex Exam by:: [name], CNM Pitocin: 2 mu/min  A/P: 25 y.o. G5P0040 [redacted]w[redacted]d  1. Labor: active, progressing well 2. FWB: Cat II 3. Pain: epidural   S/p SROM @0506 . IUPC placed, consider amnioinfusion if variables persist. Anticipate labor progress and SVD.  , CNM 9:57 AM

## 2021-02-11 NOTE — Anesthesia Preprocedure Evaluation (Signed)
Anesthesia Evaluation  Patient identified by MRN, date of birth, ID band Patient awake    Reviewed: Allergy & Precautions, Patient's Chart, lab work & pertinent test results  Airway Mallampati: II  TM Distance: >3 FB Neck ROM: Full    Dental no notable dental hx.    Pulmonary neg pulmonary ROS,    Pulmonary exam normal breath sounds clear to auscultation       Cardiovascular negative cardio ROS Normal cardiovascular exam Rhythm:Regular Rate:Normal     Neuro/Psych negative neurological ROS  negative psych ROS   GI/Hepatic negative GI ROS, Neg liver ROS,   Endo/Other  Obesity BMI 33  Renal/GU negative Renal ROS  negative genitourinary   Musculoskeletal negative musculoskeletal ROS (+)   Abdominal   Peds negative pediatric ROS (+)  Hematology negative hematology ROS (+) hct 36.9, plt 346   Anesthesia Other Findings Congenital RUE AVM -per pt was surgically repaired  Reproductive/Obstetrics (+) Pregnancy                             Anesthesia Physical Anesthesia Plan  ASA: 2  Anesthesia Plan: Epidural   Post-op Pain Management:    Induction:   PONV Risk Score and Plan: 2  Airway Management Planned: Natural Airway  Additional Equipment: None  Intra-op Plan:   Post-operative Plan:   Informed Consent: I have reviewed the patients History and Physical, chart, labs and discussed the procedure including the risks, benefits and alternatives for the proposed anesthesia with the patient or authorized representative who has indicated his/her understanding and acceptance.       Plan Discussed with:   Anesthesia Plan Comments:         Anesthesia Quick Evaluation

## 2021-02-11 NOTE — Progress Notes (Signed)
Patient ID: Maria Orr, female   DOB: February 16, 1996, 25 y.o.   MRN: [id number]  S/p cytotec x 1; had SROM ~ 0500 after epidural placement; also had some mild FHR variables at that time so Pitocin was not started; currently feeling comfortable w epidural  BP 117/59, P 116 FHR 140s, +accels, no decels, occ variables Ctx 2-5 mins, spont Cx 4/90/vtx -2, +clear fluid  IUP@37 .3wks FGR Cx favorable  -Will start Pitocin as next stop in IOL process -Anticipate vag del  Maria Orr [facility] 02/11/2021 7:52 AM

## 2021-02-11 NOTE — Discharge Summary (Signed)
Postpartum Discharge Summary   Patient Name: Maria Orr DOB: 1996/02/16 MRN: [id number]  Date of admission: 02/11/2021 Delivery date:02/11/2021  Delivering provider: Wells Guiles  Date of discharge: 02/13/2021  Admitting diagnosis: Intrauterine growth restriction (IUGR) affecting care of mother [O36.5990] Intrauterine pregnancy: [redacted]w[redacted]d    Secondary diagnosis:  Principal Problem:   SVD (spontaneous vaginal delivery) Active Problems:   AVM (congenital arteriovenous malformation)   History of cervical incompetence   Carrier of spinal muscular atrophy   Intrauterine growth restriction (IUGR) affecting care of mother  Additional problems: None    Discharge diagnosis: Term Pregnancy Delivered                                              Post partum procedures: None Augmentation: Pitocin and Cytotec Complications: None  Hospital course: Induction of Labor With Vaginal Delivery   25y.o. yo G5P0040 at 337w3das admitted to the hospital 02/11/2021 for induction of labor.  Indication for induction: FGR .  Patient had an uncomplicated labor course as follows: Membrane Rupture Time/Date: 5:06 AM ,02/11/2021   Delivery Method:Vaginal, Spontaneous  Episiotomy: None  Lacerations:  Labial;1st degree  Details of delivery can be found in separate delivery note.  Patient had a routine postpartum course and is meeting all postpartum milestones. Patient is discharged home 02/13/21.  Newborn Data: Birth date:02/11/2021  Birth time:1:06 PM  Gender:Female  Living status:Living  Apgars:8 ,9  Weight:2660 g   Magnesium Sulfate received: No BMZ received: No Rhophylac: N/A MMR: N/A T-DaP: Declined Flu: No Transfusion: No  Physical exam  Vitals:   02/12/21 0551 02/12/21 1304 02/12/21 2010 02/13/21 0525  BP: 104/86 114/75 116/75 108/74  Pulse: 88 89 90 88  Resp: _0 Temp: 98.4 F (36.9 C) 98.2 F (36.8 C) 98.1 F (36.7 C) 98.4 F (36.9 C)  TempSrc: Axillary Oral Oral  Oral  SpO2: 100%  100% 100%   General: alert, cooperative, and no distress Lochia: appropriate Uterine Fundus: firm and below umbilicus DVT Evaluation: no LE edema or calf tenderness to palpation  Labs: Lab Results  Component Value Date   WBC 11.6 (H) 02/11/2021   HGB 11.8 (L) 02/11/2021   HCT 36.9 02/11/2021   MCV 83.5 02/11/2021   PLT 346 02/11/2021   CMP Latest Ref Rng & Units 08/06/2020  Glucose 65 - 99 mg/dL 90  BUN 6 - 20 mg/dL 6  Creatinine 0.57 - 1.00 mg/dL 0.62  Sodium 134 - 144 mmol/L 139  Potassium 3.5 - 5.2 mmol/L 3.7  Chloride 96 - 106 mmol/L 103  CO2 20 - 29 mmol/L 19(L)  Calcium 8.7 - 10.2 mg/dL 9.2  Total Protein 6.0 - 8.5 g/dL 7.4  Total Bilirubin 0.0 - 1.2 mg/dL <0.2  Alkaline Phos 44 - 121 IU/L 52  AST 0 - 40 IU/L 15  ALT 0 - 32 IU/L 26   Edinburgh Score: Edinburgh Postnatal Depression Scale Screening Tool 02/11/2021  I have been able to laugh and see the funny side of things. 0  I have looked forward with enjoyment to things. 0  I have blamed myself unnecessarily when things went wrong. 0  I have been anxious or worried for no good reason. 0  I have felt scared or panicky for no good reason. 0  Things have been getting on top of me. 0  I have been so unhappy that I have had difficulty sleeping. 0  I have felt sad or miserable. 0  I have been so unhappy that I have been crying. 0  The thought of harming myself has occurred to me. 0  Edinburgh Postnatal Depression Scale Total 0    After visit meds:  Allergies as of 02/13/2021       Reactions   Aspirin Other (See Comments)   Cannot take due to clotting issues and congenital fistula of right arm.    Motrin [ibuprofen] Other (See Comments)   Cannot take due to clotting issues and congenital fistula of right arm.    Tylenol [acetaminophen] Other (See Comments)   Cannot take due to clotting issues and congenital fistula of right arm.         Medication List     STOP taking these medications     cyclobenzaprine 10 MG tablet Commonly known as: FLEXERIL       TAKE these medications    Blood Pressure Kit Devi Monitor Blood Pressure Readings at Home Regularly   oxyCODONE 5 MG immediate release tablet Commonly known as: Oxy IR/ROXICODONE Take 1 tablet (5 mg total) by mouth every 8 (eight) hours as needed (severe pain).   PrePLUS 27-1 MG Tabs Take 1 tablet by mouth daily.         Discharge home in stable condition Infant Feeding: Bottle and Breast Infant Disposition: home with mother Discharge instruction: per After Visit Summary and Postpartum booklet. Activity: Advance as tolerated. Pelvic rest for 6 weeks.  Diet: routine diet Future Appointments: Future Appointments  Date Time Provider [place]  03/18/2021 11:30 AM Monico Hoar, PT [facility]  03/25/2021 10:15 AM Woodroe Mode, MD [place] None   Follow up Visit: Please schedule this patient for a Virtual postpartum visit in 6 weeks with the following provider: Any provider. Additional Postpartum F/U: None   Low risk pregnancy complicated by: None Delivery mode: Vaginal, Spontaneous  Anticipated Birth Control: POPs - would like to start at her postpartum visit  02/13/2021 Genia Del, MD

## 2021-02-11 NOTE — Lactation Note (Signed)
This note was copied from a baby's chart. Lactation Consultation Note  Patient Name: Maria Orr [id number] Date: 02/11/2021 Reason for consult: L&D Initial assessment;1st time breastfeeding;Early term 37-38.6wks Age:25 hours  P1, Baby latched as LC entered room.  Baby sustained latch and did re-latch during consult.  Noted R nipple inverted.  Mother complaining that L nipple hurts with latch.  Repositioned baby for more depth and mother stated comfort improved.  Lactation to follow up on MBU.  Maternal Data Does the patient have breastfeeding experience prior to this delivery?: No  Feeding Mother's Current Feeding Choice: Breast Milk  LATCH Score Latch: Grasps breast easily, tongue down, lips flanged, rhythmical sucking.  Audible Swallowing: A few with stimulation  Type of Nipple: Everted at rest and after stimulation (L everted R inverted)  Comfort (Breast/Nipple): Soft / non-tender  Hold (Positioning): Assistance needed to correctly position infant at breast and maintain latch.  LATCH Score: 8   Lactation Tools Discussed/Used    Interventions Interventions: Assisted with latch;Skin to skin;Education  Discharge    Consult Status Consult Status: Follow-up from L&D    Dahlia Byes [facility] 02/11/2021, 1:54 PM

## 2021-02-11 NOTE — Lactation Note (Addendum)
This note was copied from a baby's chart. Lactation Consultation Note  Patient Name: Maria Orr [id number]'M Date: 02/11/2021 Reason for consult: Initial assessment;Infant < 6lbs;1st time breastfeeding Age:25 years, ETI female infant. Per mom ,her current feeding choice is breast and formula feeding infant. LC entered the room, infant was cuing to breastfeed. Mom was given hand pump to pre-pump right breast prior to latch infant at the breast due to her right nipple being inverted.  LC used doll module to explained hand expression with mom. Mom latched infant on her right breast using the football hold, infant sustained latch and was still breastfeeding  after 14 minutes when LC left the room. Mom made aware of O/P services, breastfeeding support groups, community resources, and our phone # for post-discharge questions.   Mom's current feeding plan: 1- Mom will breastfeed infant according to feeding cues, skin to skin. 2- Mom will pre-pump right breast with hand pump prior to latching infant. 3- Mom knows to call RN/LC if she need further assistance with latching infant at the breast. Maternal Data Has patient been taught Hand Expression?: Yes Does the patient have breastfeeding experience prior to this delivery?: No  Feeding Mother's Current Feeding Choice: Breast Milk and Formula  LATCH Score Latch: Grasps breast easily, tongue down, lips flanged, rhythmical sucking.  Audible Swallowing: Spontaneous and intermittent  Type of Nipple: Inverted  Comfort (Breast/Nipple): Soft / non-tender  Hold (Positioning): Assistance needed to correctly position infant at breast and maintain latch.  LATCH Score: 7   Lactation Tools Discussed/Used Tools: Pump Breast pump type: Manual Pump Education: Setup, frequency, and cleaning;Milk Storage Reason for Pumping: Mom will pre-pump breast prior to latching infant due to right nipple being inverted. Pumping frequency: Mom will pre-pump breast  prior to latching infant.  Interventions Interventions: Breast feeding basics reviewed;Assisted with latch;Skin to skin;Hand express;Pre-pump if needed;Adjust position;Breast compression;Support pillows;Position options;Education;LC Services brochure;Hand pump  Discharge Pump: Manual WIC Program: Yes  Consult Status Consult Status: Follow-up Date: 02/12/21 Follow-up type: In-patient    Danelle Earthly 02/11/2021, 4:15 PM

## 2021-02-11 NOTE — Anesthesia Procedure Notes (Signed)
Epidural Patient location during procedure: OB Start time: 02/11/2021 4:38 AM End time: 02/11/2021 4:49 AM  Staffing Anesthesiologist: Lannie Fields, DO Performed: anesthesiologist   Preanesthetic Checklist Completed: patient identified, IV checked, risks and benefits discussed, monitors and equipment checked, pre-op evaluation and timeout performed  Epidural Patient position: sitting Prep: DuraPrep and site prepped and draped Patient monitoring: continuous pulse ox, blood pressure, heart rate and cardiac monitor Approach: midline Location: L3-L4 Injection technique: LOR air  Needle:  Needle type: Tuohy  Needle gauge: 17 G Needle length: 9 cm Needle insertion depth: 7 cm Catheter type: closed end flexible Catheter size: 19 Gauge Catheter at skin depth: 12 cm Test dose: negative  Assessment Sensory level: T8 Events: blood not aspirated, injection not painful, no injection resistance, no paresthesia and negative IV test  Additional Notes Patient identified. Risks/Benefits/Options discussed with patient including but not limited to bleeding, infection, nerve damage, paralysis, failed block, incomplete pain control, headache, blood pressure changes, nausea, vomiting, reactions to medication both or allergic, itching and postpartum back pain. Confirmed with bedside nurse the patient's most recent platelet count. Confirmed with patient that they are not currently taking any anticoagulation, have any bleeding history or any family history of bleeding disorders. Patient expressed understanding and wished to proceed. All questions were answered. Sterile technique was used throughout the entire procedure. Please see nursing notes for vital signs. Test dose was given through epidural catheter and negative prior to continuing to dose epidural or start infusion. Warning signs of high block given to the patient including shortness of breath, tingling/numbness in hands, complete motor  block, or any concerning symptoms with instructions to call for help. Patient was given instructions on fall risk and not to get out of bed. All questions and concerns addressed with instructions to call with any issues or inadequate analgesia.  Reason for block:procedure for pain

## 2021-02-12 ENCOUNTER — Encounter: Payer: Medicaid Other | Admitting: Obstetrics and Gynecology

## 2021-02-12 NOTE — Lactation Note (Signed)
This note was copied from a baby's chart. Lactation Consultation Note  Patient Name: Maria Orr [id number] Date: 02/12/2021 Reason for consult: Follow-up assessment;Early term 37-38.6wks;1st time breastfeeding;Infant < 6lbs (-3% weight loss) Age:25 hours Per mom, infant was seen by SLP earlier today and her current feeding choice is " pumping only" and formula feeding infant. Per mom, infant recently consumed 15 mls of Enfamil 20 kcal formula with iron using Dr. Irving Burton Ultra Preemie nipple.  Per mom, she recently purchased DEBP for home, LC set up mom with DEBP, mom will pump every 3 hours for 15 minutes on initial setting. Mom shown how to use DEBP & how to disassemble, clean, & reassemble parts.  Mom knows to offer infant 15 to 30 mls of formula per feeding, mom is following SLP infant feeding guidelines. Mo will offer any EBM first before offering infant formula. Mom knows to call [facility] services if she has any breastfeeding questions or concerns.   Maternal Data    Feeding Mother's Current Feeding Choice: Breast Milk and Formula Nipple Type: Dr. Levert Feinstein Preemie  LATCH Score                    Lactation Tools Discussed/Used Breast pump type: Double-Electric Breast Pump Pump Education: Milk Storage;Setup, frequency, and cleaning Reason for Pumping: Mom current feeding choice is "pumping only" and formula feeding infant. Pumping frequency: Mom will pump every 3 hours for 15 minutes on inital setting.  Interventions    Discharge Pump: Personal (Per mom, [employer] recently purchased DEBP for mom's  home use.)  Consult Status Consult Status: Follow-up Date: 02/13/21 Follow-up type: In-patient    Danelle Earthly 02/12/2021, 10:26 PM

## 2021-02-12 NOTE — Anesthesia Postprocedure Evaluation (Signed)
Anesthesia Post Note  Patient: Maria Orr  Procedure(s) Performed: AN AD HOC LABOR EPIDURAL     Anesthesia Type: Epidural Anesthetic complications: no   No notable events documented.  Last Vitals:  Vitals:   02/12/21 0551 02/12/21 1304  BP: 104/86 114/75  Pulse: 88 89  Resp: 16 18  Temp: 36.9 C 36.8 C  SpO2: 100%     Last Pain:  Vitals:   02/12/21 1304  TempSrc: Oral  PainSc:    Pain Goal:                   Littie Chiem A.

## 2021-02-12 NOTE — Progress Notes (Signed)
Post Partum Day 1 Subjective: no complaints, up ad lib, voiding and tolerating PO, small lochia, plans to breastfeed, oral contraceptives (estrogen/progesterone)  Objective: Blood pressure 104/86, pulse 88, temperature 98.4 F (36.9 C), temperature source Axillary, resp. rate 16, last menstrual period 05/25/2020, SpO2 100 %, unknown if currently breastfeeding.  Physical Exam:  General: alert, cooperative and no distress Lochia:normal flow Chest: CTAB Heart: RRR no m/r/g Abdomen: +BS, soft, nontender,  Uterine Fundus: firm DVT Evaluation: No evidence of DVT seen on physical exam. Extremities: no edema  Recent Labs    02/11/21 0141  HGB 11.8*  HCT 36.9    Assessment/Plan: Plan for discharge tomorrow   LOS: 1 day   Jacklyn Shell 02/12/2021, 8:24 AM

## 2021-02-13 MED ORDER — OXYCODONE HCL 5 MG PO TABS: 5.0000 mg | ORAL_TABLET | Freq: Three times a day (TID) | ORAL | 0 refills | Status: DC | PRN

## 2021-02-13 NOTE — Lactation Note (Signed)
This note was copied from a baby's chart. Lactation Consultation Note  Patient Name: Maria Orr [id number] Date: 02/13/2021 Reason for consult: Follow-up assessment;Early term 37-38.6wks;1st time breastfeeding;Infant < 6lbs;Infant weight loss;Other (Comment) (6 % weight loss/ 40 hours - Bilirubin- 8.4,/ SLP has been following baby - using a Dr. Manson Passey Premie nipple) - Baby is improving with the volume with the feedings .  LC recommended to mom when its due to feed the baby check the diaper / change if needed, undress so the baby isn't to warm so he will feed better and take the adequate volume per feedings.  Age:26 hours LC reviewed with mom supply and demand / importance of being consistent with pumping around the clock to establish the milk supply and protect the level.  Mom had mentioned she will be F/U with Speech Pathologist after D/C.  See Below for the BF D/C teaching.   Maternal Data    Feeding Mother's Current Feeding Choice: Breast Milk and Formula Nipple Type: Dr. Lorne Skeens  [facility] Score                    Lactation Tools Discussed/Used Pump Education: Milk Storage  Interventions Interventions: Breast feeding basics reviewed;Education  Discharge Discharge Education: Engorgement and breast care;Warning signs for feeding baby;Outpatient recommendation;Other (comment) (per mom will call back once the weight increases for Lc O/P appt.) Pump: Personal;DEBP;Manual  Consult Status Consult Status: Complete Date: 02/13/21    Kathrin Greathouse 02/13/2021, 2:40 PM

## 2021-02-13 NOTE — Addendum Note (Signed)
Addendum  created 02/13/21 0742 by Renford Dills, CRNA   Clinical Note Signed

## 2021-02-13 NOTE — Anesthesia Postprocedure Evaluation (Signed)
Anesthesia Post Note  Patient: Maria Orr  Procedure(s) Performed: AN AD HOC LABOR EPIDURAL     Patient location during evaluation: Mother Baby Anesthesia Type: Epidural Level of consciousness: awake and alert Pain management: pain level controlled Vital Signs Assessment: post-procedure vital signs reviewed and stable Respiratory status: spontaneous breathing, nonlabored ventilation and respiratory function stable Cardiovascular status: stable Postop Assessment: no headache, no backache and epidural receding Anesthetic complications: no   No notable events documented.  Last Vitals:  Vitals:   02/12/21 2010 02/13/21 0525  BP: 116/75 108/74  Pulse: 90 88  Resp: 18 16  Temp: 36.7 C 36.9 C  SpO2: 100% 100%    Last Pain:  Vitals:   02/13/21 0526  TempSrc:   PainSc: 0-No pain   Pain Goal:                   Jerre Vandrunen

## 2021-02-23 ENCOUNTER — Telehealth (HOSPITAL_COMMUNITY): Payer: Self-pay

## 2021-02-23 NOTE — Telephone Encounter (Signed)
"  I'm doing a lot better. Doing wonderful, I'm feeling good." Patient has no questions or concerns about her healing.  "He's a great baby. He sleeps well. He drinks 2 ounce bottles now and has gained weight well. He weighs 6lb 10oz now. He sleeps in his crib in my room." RN reviewed ABC's of safe sleep with patient. Patient declines any questions or concerns about baby.  EPDS score is 0.  Marcelino Duster [facility] 02/23/2021,1417

## 2021-03-15 ENCOUNTER — Telehealth: Payer: Self-pay | Admitting: Physical Therapy

## 2021-03-15 NOTE — Telephone Encounter (Signed)
Called patient about her appointment on 12/14 to see if she still needed therapy. Patient had her baby on 11/10 and is not experiencing any pain. She wanted to be taken off the schedule.  Eulis Foster, PT @12 /03/2021@ 4:57 PM

## 2021-03-18 ENCOUNTER — Encounter: Payer: Medicaid Other | Admitting: Physical Therapy

## 2021-03-25 ENCOUNTER — Telehealth (INDEPENDENT_AMBULATORY_CARE_PROVIDER_SITE_OTHER): Payer: Medicaid Other | Admitting: Obstetrics & Gynecology

## 2021-03-25 ENCOUNTER — Encounter: Payer: Self-pay | Admitting: Obstetrics & Gynecology

## 2021-03-25 NOTE — Progress Notes (Signed)
Provider location: Center for [facility] at [facility]   Patient location: Home  I connected withNAME@ on 03/25/21 at 10:15 AM EST by Mychart Video Encounter and verified that I am speaking with the correct person using two identifiers.       I discussed the limitations, risks, security and privacy concerns of performing an evaluation and management service virtually and the availability of in person appointments. I also discussed with the patient that there may be a patient responsible charge related to this service. The patient expressed understanding and agreed to proceed.  Post Partum Visit Note Subjective:   Maria Orr is a 25 y.o. [phone] female who presents for a postpartum visit. She is 6 weeks postpartum following a normal spontaneous vaginal delivery.  I have fully reviewed the prenatal and intrapartum course. The delivery was at 37 gestational weeks.  Anesthesia: epidural. Postpartum course has been uncomplicated. Baby is doing well. Baby is feeding by both breast and bottle - Enfamil Neuropro . Bleeding no bleeding. Bowel function is normal. Bladder function is normal. Patient is not sexually active. Contraception method is oral progesterone-only contraceptive. Postpartum depression screening: negative.   The pregnancy intention screening data noted above was reviewed. Potential methods of contraception were discussed. The patient elected to proceed with No data recorded.   Edinburgh Postnatal Depression Scale - 03/25/21 1003       Edinburgh Postnatal Depression Scale:  In the Past 7 Days   I have been able to laugh and see the funny side of things. 0    I have looked forward with enjoyment to things. 0    I have blamed myself unnecessarily when things went wrong. 0    I have been anxious or worried for no good reason. 0    I have felt scared or panicky for no good reason. 0    Things have been getting on top of me. 0    I have been so unhappy that I have had  difficulty sleeping. 0    I have felt sad or miserable. 0    I have been so unhappy that I have been crying. 0    The thought of harming myself has occurred to me. 0    Edinburgh Postnatal Depression Scale Total 0             The following portions of the patient's history were reviewed and updated as appropriate: allergies, current medications, past family history, past medical history, past social history, past surgical history, and problem list.  Review of Systems Pertinent items noted in HPI and remainder of comprehensive ROS otherwise negative.  Objective:  BP 121/80    Pulse 100    LMP 05/25/2020    Breastfeeding Yes     General:  Alert, oriented and cooperative. Patient is in no acute distress.  Respiratory: Normal respiratory effort, no problems with respiration noted  Mental Status: Normal mood and affect. Normal behavior. Normal judgment and thought content.  Rest of physical exam deferred due to type of encounter   Assessment:    normal postpartum exam.  Plan:  Essential components of care per ACOG recommendations:  1.  Mood and well being: Patient with negative depression screening today. Reviewed local resources for support.  - Patient does not use tobacco.  - hx of drug use? No    2. Infant care and feeding:  -Patient currently breastmilk feeding? No  -Social determinants of health (SDOH) reviewed in EPIC. No concerns 3. Sexuality, contraception  and birth spacing - Patient does not want a pregnancy in the next year.  Desired family size is 2 children.  - Reviewed forms of contraception in tiered fashion. Patient desired oral progesterone-only contraceptive today.   - Discussed birth spacing of 18 months  4. Sleep and fatigue -Encouraged family/partner/community support of 4 hrs of uninterrupted sleep to help with mood and fatigue  5. Physical Recovery  - Discussed patients delivery - Patient had a 1 degree laceration, perineal healing reviewed. Patient  expressed understanding - Patient has urinary incontinence? No  - Patient is safe to resume physical and sexual activity  6.  Health Maintenance - Last pap smear done 03/2019 and was normal with negative HPV.  7. No Chronic Disease - PCP follow up  I provided 12 minutes of face-to-face time during this encounter.    No follow-ups on file.  No future appointments.  Adam Phenix, MD  Center for [facility], [facility]

## 2021-03-25 NOTE — Progress Notes (Deleted)
° ° °  Post Partum Visit Note  Maria Orr is a 25 y.o. [phone] female who presents for a postpartum visit. She is 6 weeks postpartum following a normal spontaneous vaginal delivery.  I have fully reviewed the prenatal and intrapartum course. The delivery was at 37 gestational weeks.  Anesthesia: epidural. Postpartum course has been uncomplicated. Baby is doing well. Baby is feeding by both breast and bottle - Enfamil Neuropro . Bleeding no bleeding. Bowel function is normal. Bladder function is normal. Patient is not sexually active. Contraception method is oral progesterone-only contraceptive. Postpartum depression screening: negative.   The pregnancy intention screening data noted above was reviewed. Potential methods of contraception were discussed. The patient elected to proceed with No data recorded.    Health Maintenance Due  Topic Date Due   COVID-19 Vaccine (3 - Booster for Pfizer series) 02/04/2020    {Common ambulatory SmartLinks:19316}  Review of Systems {ros; complete:30496}  Objective:  LMP 05/25/2020    General:  {gen appearance:16600}   Breasts:  {desc; normal/abnormal/not indicated:14647}  Lungs: {lung exam:16931}  Heart:  {heart exam:5510}  Abdomen: {abdomen exam:16834}   Wound {Wound assessment:11097}  GU exam:  {desc; normal/abnormal/not indicated:14647}       Assessment:    There are no diagnoses linked to this encounter.  *** postpartum exam.   Plan:   Essential components of care per ACOG recommendations:  1.  Mood and well being: Patient with {gen negative/positive:315881} depression screening today. Reviewed local resources for support.  - Patient tobacco use? {tobacco use:25506}  - hx of drug use? {yes/no:25505}    2. Infant care and feeding:  -Patient currently breastmilk feeding? {yes/no:25502}  -Social determinants of health (SDOH) reviewed in EPIC. No concerns***The following needs were identified***  3. Sexuality, contraception and  birth spacing - Patient {DOES_DOES SEG:31517} want a pregnancy in the next year.  Desired family size is {NUMBER 1-10:22536} children.  - Reviewed forms of contraception in tiered fashion. Patient desired {PLAN CONTRACEPTION:313102} today.   - Discussed birth spacing of 18 months  4. Sleep and fatigue -Encouraged family/partner/community support of 4 hrs of uninterrupted sleep to help with mood and fatigue  5. Physical Recovery  - Discussed patients delivery and complications. She describes her labor as {description:25511} - Patient had a {CHL AMB DELIVERY:[phone]}. Patient had a {laceration:25518} laceration. Perineal healing reviewed. Patient expressed understanding - Patient has urinary incontinence? {yes/no:25515} - Patient {ACTION; IS/IS OHY:07371062} safe to resume physical and sexual activity  6.  Health Maintenance - HM due items addressed {Yes or If no, why not?:20788} - Last pap smear No results found for: DIAGPAP Pap smear {done:10129} at today's visit.  -Breast Cancer screening indicated? {indicated:25516}  7. Chronic Disease/Pregnancy Condition follow up: {Follow up:25499}  - PCP follow up  Hamilton Capri, RN Center for [employer], [facility]

## 2021-04-22 ENCOUNTER — Ambulatory Visit: Payer: Medicaid Other | Admitting: Obstetrics

## 2023-11-11 ENCOUNTER — Other Ambulatory Visit: Payer: Self-pay

## 2023-11-11 ENCOUNTER — Emergency Department (HOSPITAL_COMMUNITY)
Admission: EM | Admit: 2023-11-11 | Discharge: 2023-11-11 | Disposition: A | Discharge status: Home / Self Care | Attending: Emergency Medicine | Admitting: Emergency Medicine

## 2023-11-11 ENCOUNTER — Encounter (HOSPITAL_COMMUNITY): Payer: Self-pay | Admitting: Emergency Medicine

## 2023-11-11 DIAGNOSIS — S40021A Contusion of right upper arm, initial encounter: Secondary | ICD-10-CM | POA: Diagnosis not present

## 2023-11-11 DIAGNOSIS — W51XXXA Accidental striking against or bumped into by another person, initial encounter: Secondary | ICD-10-CM | POA: Insufficient documentation

## 2023-11-11 DIAGNOSIS — S4991XA Unspecified injury of right shoulder and upper arm, initial encounter: Secondary | ICD-10-CM | POA: Diagnosis present

## 2023-11-11 DIAGNOSIS — R58 Hemorrhage, not elsewhere classified: Secondary | ICD-10-CM

## 2023-11-11 NOTE — ED Provider Notes (Signed)
 [place] EMERGENC[facility] DEPARTMENT AT [facility] Provider Note  CSN: [id number] Arrival date & time: 11/11/23 8166  Chief Complaint(s) Abscess  HPI Maria Orr is a 28 y.o. 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  Patient reports that her toddler accidentally kicked her in the right upper arm.  She was concerned that she might have a blood clot or an infection as she had some slight increase swelling.  No chest pain, shortness of breath.  No fevers or chills.  Does not take a blood thinner and has not ever taken 1.  Currently not seeing anyone for her AVM but has scheduled follow-up with [facility] vascular surgery in September.  No wounds.  Past Medical History Past Medical History:  Diagnosis Date   Arteriovenous malformation    Right arm    Chlamydia    Hx MRSA infection    Vascular malformation of upper extremity    R arm   Patient Active Problem List   Diagnosis Date Noted   Intrauterine growth restriction (IUGR) affecting care of mother 02/11/2021   SVD (spontaneous vaginal delivery) 02/11/2021   History of preterm premature rupture of membranes (PROM) in previous pregnancy, currently pregnant 01/14/2021   SUI (stress urinary incontinence, female) 10/11/2020   History of ectopic pregnancy 09/03/2020   History of cervical incompetence 09/03/2020   Pregnancy with poor obstetric history 09/03/2020   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 Authorizing Provider  Blood Pressure Monitoring (BLOOD PRESSURE KIT) DEVI Monitor Blood Pressure Readings at Home Regularly Patient not taking: Reported on 03/25/2021 07/30/20   Constant, Peggy, MD  Prenatal Vit-Fe Fumarate-FA (PREPLUS) 27-1 MG TABS Take 1  tablet by mouth daily. 07/30/20   Constant, Winton, MD                                                                                                                                    Past Surgical History Past Surgical History:  Procedure Laterality Date   Surgery R arm Right    Remove blood clots.   Family History Family History  Problem Relation Age of Onset   Neuropathy Mother    Cancer Brother     Social History Social History   Tobacco Use   Smoking status: Never   Smokeless tobacco: Never  Vaping Use   Vaping status: Never Used  Substance Use Topics   Alcohol use: No   Drug use: No   Allergies Aspirin, Motrin  [ibuprofen ], and Tylenol  [acetaminophen ]  Review of Systems Review of Systems  All other systems reviewed and are negative.   Physical Exam Vital Signs  I have reviewed the triage vital signs BP 133/83 (BP Location: Left Arm)   Pulse  78   Temp 97.7 F (36.5 C) (Oral)   Resp 16   SpO2 100%  Physical Exam Vitals and nursing note reviewed.  Constitutional:      Appearance: Normal appearance.  HENT:     Head: Normocephalic and atraumatic.     Mouth/Throat:     Mouth: Mucous membranes are moist.  Eyes:     Conjunctiva/sclera: Conjunctivae normal.  Cardiovascular:     Rate and Rhythm: Normal rate.     Heart sounds: No murmur heard. Pulmonary:     Effort: Pulmonary effort is normal. No respiratory distress.  Abdominal:     General: Abdomen is flat.  Musculoskeletal:        General: No deformity.     Comments: Right upper extremity with swelling with areas of enlarged vasculature throughout most prominently in the right AC.  Distal 2+ pulse.  Small area of ecchymosis on the right upper arm.  No warmth.  No erythema.  No fluctuance.  Skin:    General: Skin is warm and dry.     Capillary Refill: Capillary refill takes less than 2 seconds.  Neurological:     General: No focal deficit present.     Mental Status: She is alert. Mental status is at  baseline.  Psychiatric:        Mood and Affect: Mood normal.        Behavior: Behavior normal.     ED Results and Treatments Labs (all labs ordered are listed, but only abnormal results are displayed) Labs Reviewed - No data to display                                                                                                                        Radiology No results found.  Pertinent labs & imaging results that were available during my care of the patient were reviewed by me and considered in my medical decision making (see MDM for details).  Medications Ordered in ED Medications - No data to display                                                                                                                                   Procedures Procedures  (including critical care time)  Medical Decision Making / ED Course   MDM:  28 year old presenting to the emergency department with injury.  On exam, patient has small area of bruising to the  right upper arm.  She was concerned that this could have affected her chronic AVM.  She reports that her swelling is chronic, maybe slightly worse than normal.  Her distal circulation is normal as well as distal neurovascular function.  There is no signs of acute infection or wound.  Feel patient is stable for discharge at this time, lower concern for DVT but will order outpatient DVT study, not able to obtain today as technician is gone for today.  Recommended patient continue to follow-up with [facility]. Will discharge patient to home. All questions answered. Patient comfortable with plan of discharge. Return precautions discussed with patient and specified on the after visit summary.      -External records from outside source obtained and reviewed including: Chart review including previous notes, labs, imaging, consultation notes including prior notes, imaging results     Medicines ordered and prescription drug management: No orders of  the defined types were placed in this encounter.   -I have reviewed the patients home medicines and have made adjustments as needed  Co morbidities that complicate the patient evaluation  Past Medical History:  Diagnosis Date   Arteriovenous malformation    Right arm    Chlamydia    Hx MRSA infection    Vascular malformation of upper extremity    R arm      Dispostion: Disposition decision including need for hospitalization was considered, and patient discharged from emergency department.    Final Clinical Impression(s) / ED Diagnoses Final diagnoses:  Ecchymosis     This chart was dictated using voice recognition software.  Despite best efforts to proofread,  errors can occur which can change the documentation meaning.    Francesca Elsie CROME, MD 11/11/23 [phone]

## 2023-11-11 NOTE — ED Triage Notes (Signed)
 Pt reports hx of vascular issues to right arm.  On Thursday her child was playing and kicked her in the upper right arm.  Area started small but now has grown and turned darker.  Concerned about blood clot.

## 2023-11-11 NOTE — Discharge Instructions (Signed)
 We evaluated you for your arm bruising and swelling. We would like you to obtain an ultrasound tomorrow. We didn't see any signs of an infection. Please return if you develop any bleeding, redness, increasing pain or swelling, fevers, or any other concerning symptoms.

## 2023-11-12 ENCOUNTER — Ambulatory Visit (HOSPITAL_COMMUNITY): Admission: RE | Admit: 2023-11-12 | Source: Ambulatory Visit

## 2023-11-20 ENCOUNTER — Ambulatory Visit (HOSPITAL_COMMUNITY): Admission: EM | Admit: 2023-11-20 | Discharge: 2023-11-20 | Disposition: A | Discharge status: Home / Self Care
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
